# Patient Record
Sex: Female | Born: 1970 | Race: White | Hispanic: No | Smoking: Never smoker
Health system: Southern US, Community
[De-identification: ages and names within clinical notes are randomized; demographics above are authoritative.]

## PROBLEM LIST (undated history)

## (undated) DIAGNOSIS — C539 Malignant neoplasm of cervix uteri, unspecified: Secondary | ICD-10-CM

## (undated) DIAGNOSIS — G510 Bell's palsy: Secondary | ICD-10-CM

---

## 1998-03-13 ENCOUNTER — Ambulatory Visit (HOSPITAL_COMMUNITY): Admission: RE | Admit: 1998-03-13 | Discharge: 1998-03-13 | Payer: Self-pay | Admitting: Obstetrics & Gynecology

## 1998-08-26 ENCOUNTER — Other Ambulatory Visit: Admission: RE | Admit: 1998-08-26 | Discharge: 1998-08-26 | Payer: Self-pay | Admitting: Obstetrics & Gynecology

## 1998-12-09 ENCOUNTER — Other Ambulatory Visit: Admission: RE | Admit: 1998-12-09 | Discharge: 1998-12-09 | Payer: Self-pay | Admitting: Obstetrics & Gynecology

## 1999-04-15 ENCOUNTER — Other Ambulatory Visit: Admission: RE | Admit: 1999-04-15 | Discharge: 1999-04-15 | Payer: Self-pay | Admitting: Obstetrics & Gynecology

## 1999-05-29 ENCOUNTER — Encounter: Payer: Self-pay | Admitting: Emergency Medicine

## 1999-05-29 ENCOUNTER — Emergency Department (HOSPITAL_COMMUNITY): Admission: EM | Admit: 1999-05-29 | Discharge: 1999-05-29 | Payer: Self-pay | Admitting: Emergency Medicine

## 1999-10-09 ENCOUNTER — Emergency Department (HOSPITAL_COMMUNITY): Admission: EM | Admit: 1999-10-09 | Discharge: 1999-10-09 | Payer: Self-pay | Admitting: Emergency Medicine

## 1999-10-12 ENCOUNTER — Emergency Department (HOSPITAL_COMMUNITY): Admission: EM | Admit: 1999-10-12 | Discharge: 1999-10-12 | Payer: Self-pay | Admitting: Emergency Medicine

## 2000-03-16 ENCOUNTER — Other Ambulatory Visit: Admission: RE | Admit: 2000-03-16 | Discharge: 2000-03-16 | Payer: Self-pay | Admitting: *Deleted

## 2000-10-31 ENCOUNTER — Encounter: Payer: Self-pay | Admitting: *Deleted

## 2000-10-31 ENCOUNTER — Ambulatory Visit (HOSPITAL_COMMUNITY): Admission: RE | Admit: 2000-10-31 | Discharge: 2000-10-31 | Payer: Self-pay | Admitting: *Deleted

## 2000-12-27 ENCOUNTER — Other Ambulatory Visit: Admission: RE | Admit: 2000-12-27 | Discharge: 2000-12-27 | Payer: Self-pay | Admitting: Obstetrics and Gynecology

## 2001-02-09 ENCOUNTER — Inpatient Hospital Stay (HOSPITAL_COMMUNITY): Admission: AD | Admit: 2001-02-09 | Discharge: 2001-02-09 | Payer: Self-pay | Admitting: Obstetrics and Gynecology

## 2001-06-29 ENCOUNTER — Inpatient Hospital Stay (HOSPITAL_COMMUNITY): Admission: AD | Admit: 2001-06-29 | Discharge: 2001-06-29 | Payer: Self-pay | Admitting: Obstetrics and Gynecology

## 2001-07-09 ENCOUNTER — Inpatient Hospital Stay (HOSPITAL_COMMUNITY): Admission: AD | Admit: 2001-07-09 | Discharge: 2001-07-11 | Payer: Self-pay | Admitting: Obstetrics and Gynecology

## 2002-03-01 ENCOUNTER — Encounter: Payer: Self-pay | Admitting: *Deleted

## 2002-03-01 ENCOUNTER — Emergency Department (HOSPITAL_COMMUNITY): Admission: EM | Admit: 2002-03-01 | Discharge: 2002-03-01 | Payer: Self-pay | Admitting: *Deleted

## 2005-06-06 ENCOUNTER — Inpatient Hospital Stay (HOSPITAL_COMMUNITY): Admission: AD | Admit: 2005-06-06 | Discharge: 2005-06-06 | Payer: Self-pay | Admitting: Obstetrics and Gynecology

## 2005-08-19 ENCOUNTER — Encounter: Admission: RE | Admit: 2005-08-19 | Discharge: 2005-08-19 | Payer: Self-pay | Admitting: Obstetrics and Gynecology

## 2007-06-07 ENCOUNTER — Emergency Department (HOSPITAL_COMMUNITY): Admission: EM | Admit: 2007-06-07 | Discharge: 2007-06-07 | Payer: Self-pay | Admitting: Emergency Medicine

## 2008-01-13 ENCOUNTER — Emergency Department (HOSPITAL_COMMUNITY): Admission: EM | Admit: 2008-01-13 | Discharge: 2008-01-13 | Payer: Self-pay | Admitting: Family Medicine

## 2009-10-01 ENCOUNTER — Emergency Department (HOSPITAL_COMMUNITY): Admission: EM | Admit: 2009-10-01 | Discharge: 2009-10-01 | Payer: Self-pay | Admitting: Family Medicine

## 2009-10-06 ENCOUNTER — Emergency Department (HOSPITAL_COMMUNITY): Admission: EM | Admit: 2009-10-06 | Discharge: 2009-10-06 | Payer: Self-pay | Admitting: Family Medicine

## 2010-03-12 ENCOUNTER — Emergency Department (HOSPITAL_BASED_OUTPATIENT_CLINIC_OR_DEPARTMENT_OTHER): Admission: EM | Admit: 2010-03-12 | Discharge: 2010-03-12 | Payer: Self-pay | Admitting: Emergency Medicine

## 2010-05-25 ENCOUNTER — Emergency Department (HOSPITAL_COMMUNITY)
Admission: EM | Admit: 2010-05-25 | Discharge: 2010-05-25 | Payer: Self-pay | Source: Home / Self Care | Admitting: Family Medicine

## 2010-05-26 ENCOUNTER — Emergency Department (HOSPITAL_BASED_OUTPATIENT_CLINIC_OR_DEPARTMENT_OTHER)
Admission: EM | Admit: 2010-05-26 | Discharge: 2010-05-26 | Payer: Self-pay | Source: Home / Self Care | Admitting: Emergency Medicine

## 2010-09-09 ENCOUNTER — Inpatient Hospital Stay (HOSPITAL_COMMUNITY)
Admission: AD | Admit: 2010-09-09 | Discharge: 2010-09-09 | Payer: Self-pay | Source: Home / Self Care | Attending: Obstetrics and Gynecology | Admitting: Obstetrics and Gynecology

## 2010-09-13 LAB — URINALYSIS, ROUTINE W REFLEX MICROSCOPIC
Bilirubin Urine: NEGATIVE
Hgb urine dipstick: NEGATIVE
Ketones, ur: NEGATIVE mg/dL
Nitrite: NEGATIVE
Protein, ur: NEGATIVE mg/dL
Specific Gravity, Urine: >1.03 — ABNORMAL HIGH (ref 1.005–1.030)
Urine Glucose, Fasting: NEGATIVE mg/dL
Urobilinogen, UA: 0.2 mg/dL (ref 0.0–1.0)
pH: 5.5 (ref 5.0–8.0)

## 2010-09-13 LAB — POCT PREGNANCY, URINE: Preg Test, Ur: NEGATIVE

## 2010-11-11 LAB — POCT URINALYSIS DIPSTICK
Bilirubin Urine: NEGATIVE
Glucose, UA: NEGATIVE mg/dL
Hgb urine dipstick: NEGATIVE
Ketones, ur: NEGATIVE mg/dL
Nitrite: NEGATIVE
Protein, ur: NEGATIVE mg/dL
Specific Gravity, Urine: 1.015 (ref 1.005–1.030)
Urobilinogen, UA: 0.2 mg/dL (ref 0.0–1.0)
pH: 7 (ref 5.0–8.0)

## 2010-11-11 LAB — POCT PREGNANCY, URINE: Preg Test, Ur: NEGATIVE

## 2011-01-14 NOTE — H&P (Signed)
[facility] [employer]  Patient:    [name], [name] Visit Number: [id number] MRN: [id number]          Service Type: Attending:  Naima A. Normand [name], M.D. Dictated by:   Tracey [name], C.N.M. Adm. Date:  07/09/01                           History and Physical  HISTORY OF PRESENT ILLNESS:   Tracey [name] is a 40 year old gravida 4 para 0-2-1-2 at 46 weeks, EDD July 23, 2001, who presents with spontaneous rupture of membranes at home at approximately midnight for clear fluid.  She reports positive fetal movement, no bleeding.  Denies any headache, visual changes, or epigastric pain.  Her pregnancy has been followed by the M.D. service at [facility] and is remarkable for: 1. Questionable incompetent cervix. 2. History of rapid labor. 3. Two preterm deliveries at 36 weeks. 4. History of LEEP. 5. History of preterm labor with cervix change. 6. History of macrosomia - 10 pound 5 ounce infant delivered at 36 weeks. 7. Bells palsy. 8. MRI in the first trimester secondary to Bells palsy. 9. Group B strep negative.  This patient was initially evaluated at the office of [facility] on December 14, 2000 at approximately eight weeks gestation.  EDC determined by dates confirmed with pregnancy ultrasonography.  Her pregnancy has been essentially unremarkable.  A cerclage was recommended and declined by the patient. [facility] for back pain and physical therapy.  She has been normotensive throughout with no proteinuria.  PRENATAL LABORATORY DATA:     On Dec 27, 2000:  Hemoglobin and hematocrit 11.9 and 35.9; platelets 192,000.  Blood type and Rh O positive, antibody screen negative.  VDRL nonreactive.  Rubella immune.  Hepatitis B surface antigen negative.  HIV declined.  Pap smear within normal limits.  GC and chlamydia negative.  AFP/free beta HCG within normal range.  At 28 weeks, one-hour glucose challenge 84 and  hemoglobin 13.8.  At 36 weeks, culture of the vaginal tract is negative for group B strep.  OBSTETRICAL HISTORY:          In 1994 at 36 weeks, a normal spontaneous vaginal delivery with the birth of a 7 pound 14 ounce female infant named Nutritional therapist; rapid labor - patient went from 4 cm to 10 cm in 10 minutes.  In 1996 at 36 weeks, a normal spontaneous vaginal delivery with the birth of a 10 pound 5 ounce female infant named Morrie Sheldon.  Patient delivered in MAU secondary to rapid labor.  In 1998, first trimester elective AB with no complications, and the present pregnancy.  ALLERGIES:                    PENICILLIN and SULFA.  MEDICATIONS:                  Prenatal vitamins.  HABITS:                       Patient denies the use of tobacco, alcohol, or illicit drugs.  MEDICAL HISTORY:              Anemia with her first pregnancy.  Patient has a history of early dilation at 32 weeks x 2 and delivery at 36 weeks with premature rupture of membranes at 36 weeks x 1.  Patient with a history of abnormal Pap  smear and LEEP procedure.  Pap smears have been within normal limits since that time.  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  Patient with a history of Bells palsy in February 2001.  FAMILY HISTORY:               Patients mother with a history of hypertension. Mother and maternal grandmother with varicosities.  Patients mother with a history of emphysema.  Maternal grandmother - breast cancer.  Maternal grandfather - lung cancer.  Maternal grandmother - stroke.  Maternal grandfather - Alzheimers disease.  GENETIC HISTORY:              Unremarkable.  There is no family history of familial or genetic disorders, children that died in infancy or that were born with birth defects.  SOCIAL HISTORY:               Tracey [name] is a 40 year old Caucasian single female.  The father of the baby, Tracey [name], is involved and supportive. They are of the [employer] faith.  REVIEW OF SYSTEMS:            There are no signs or symptoms suggestive of focal or systemic disease and the patient is typical of one with a uterine pregnancy at term with premature rupture of membranes in early labor.  PHYSICAL EXAMINATION:  VITAL SIGNS:                  Stable, afebrile.  HEENT:                        Unremarkable.  HEART:                        Regular rate and rhythm.  LUNGS:                        Clear.  ABDOMEN:                      Gravid in its contour.  Uterine fundus is noted to extend 40 cm above the level of the pubic symphysis.  Leopolds maneuvers find the infant to be in a longitudinal lie, cephalic presentation, and the estimated fetal weight is 8.5 pounds.  The baseline of the fetal heart rate monitor is 140s.  It is reactive and reassuring.  Patient is contracting approximately every six minutes, mildly and irregularly.  PELVIC:                       She is grossly ruptured for a large amount of clear fluid, fern positive.  Digital exam of the cervix finds it to be 3 cm dilated, 90% effaced, with the cephalic presenting part at a -1 station.  EXTREMITIES:                  Show no pathologic edema.  DTRs are 1+ with no clonus.  ASSESSMENT:                   1. Intrauterine pregnancy at term.                               2. Premature rupture of membranes.  3. Early labor.  PLAN:                         1. Admit per Dr. Jaymes [name].                               2. Routine M.D. orders.                               3. Follow expectantly in anticipation of                                  spontaneous vaginal delivery. Dictated by:   Tracey [name], C.N.M. Attending:  Naima A. Normand [name], M.D. DD:  07/09/01 TD:  07/09/01 Job: [id number]

## 2011-06-09 LAB — URINALYSIS, ROUTINE W REFLEX MICROSCOPIC
Bilirubin Urine: NEGATIVE
Glucose, UA: NEGATIVE
Ketones, ur: NEGATIVE
Nitrite: NEGATIVE
Protein, ur: 100 — AB
Specific Gravity, Urine: 1.02
Urobilinogen, UA: 1
pH: 8

## 2011-06-09 LAB — URINE MICROSCOPIC-ADD ON

## 2013-06-16 ENCOUNTER — Encounter (HOSPITAL_COMMUNITY): Payer: Self-pay | Admitting: Emergency Medicine

## 2013-06-16 ENCOUNTER — Emergency Department (HOSPITAL_COMMUNITY)
Admission: EM | Admit: 2013-06-16 | Discharge: 2013-06-16 | Disposition: A | Discharge status: Home / Self Care | Payer: Self-pay | Attending: Emergency Medicine | Admitting: Emergency Medicine

## 2013-06-16 DIAGNOSIS — Z859 Personal history of malignant neoplasm, unspecified: Secondary | ICD-10-CM | POA: Insufficient documentation

## 2013-06-16 DIAGNOSIS — N949 Unspecified condition associated with female genital organs and menstrual cycle: Secondary | ICD-10-CM | POA: Insufficient documentation

## 2013-06-16 DIAGNOSIS — Z880 Allergy status to penicillin: Secondary | ICD-10-CM | POA: Insufficient documentation

## 2013-06-16 DIAGNOSIS — R102 Pelvic and perineal pain: Secondary | ICD-10-CM

## 2013-06-16 DIAGNOSIS — R110 Nausea: Secondary | ICD-10-CM | POA: Insufficient documentation

## 2013-06-16 DIAGNOSIS — Z3202 Encounter for pregnancy test, result negative: Secondary | ICD-10-CM | POA: Insufficient documentation

## 2013-06-16 LAB — CBC WITH DIFFERENTIAL/PLATELET
Basophils Absolute: 0 10*3/uL (ref 0.0–0.1)
Basophils Relative: 0 % (ref 0–1)
Eosinophils Absolute: 0.2 10*3/uL (ref 0.0–0.7)
Eosinophils Relative: 2 % (ref 0–5)
HCT: 33.2 % — ABNORMAL LOW (ref 36.0–46.0)
Hemoglobin: 11.3 g/dL — ABNORMAL LOW (ref 12.0–15.0)
Lymphocytes Relative: 27 % (ref 12–46)
Lymphs Abs: 2.1 10*3/uL (ref 0.7–4.0)
MCH: 29.7 pg (ref 26.0–34.0)
MCHC: 34 g/dL (ref 30.0–36.0)
MCV: 87.1 fL (ref 78.0–100.0)
Monocytes Absolute: 0.6 10*3/uL (ref 0.1–1.0)
Monocytes Relative: 8 % (ref 3–12)
Neutro Abs: 4.9 10*3/uL (ref 1.7–7.7)
Neutrophils Relative %: 64 % (ref 43–77)
Platelets: 242 10*3/uL (ref 150–400)
RBC: 3.81 MIL/uL — ABNORMAL LOW (ref 3.87–5.11)
RDW: 13 % (ref 11.5–15.5)
WBC: 7.7 10*3/uL (ref 4.0–10.5)

## 2013-06-16 LAB — COMPREHENSIVE METABOLIC PANEL
ALT: 9 U/L (ref 0–35)
AST: 15 U/L (ref 0–37)
Albumin: 3.9 g/dL (ref 3.5–5.2)
Alkaline Phosphatase: 102 U/L (ref 39–117)
BUN: 10 mg/dL (ref 6–23)
CO2: 23 mEq/L (ref 19–32)
Calcium: 8.6 mg/dL (ref 8.4–10.5)
Chloride: 103 mEq/L (ref 96–112)
Creatinine, Ser: 0.75 mg/dL (ref 0.50–1.10)
GFR calc Af Amer: >90 mL/min (ref >90)
GFR calc non Af Amer: >90 mL/min (ref >90)
Glucose, Bld: 101 mg/dL — ABNORMAL HIGH (ref 70–99)
Potassium: 3.7 mEq/L (ref 3.5–5.1)
Sodium: 137 mEq/L (ref 135–145)
Total Bilirubin: 0.3 mg/dL (ref 0.3–1.2)
Total Protein: 7.1 g/dL (ref 6.0–8.3)

## 2013-06-16 LAB — POCT PREGNANCY, URINE: Preg Test, Ur: NEGATIVE

## 2013-06-16 LAB — URINALYSIS, ROUTINE W REFLEX MICROSCOPIC
Glucose, UA: NEGATIVE mg/dL
Ketones, ur: 15 mg/dL — AB
Nitrite: NEGATIVE
Protein, ur: 100 mg/dL — AB
Specific Gravity, Urine: 1.019 (ref 1.005–1.030)
Urobilinogen, UA: 0.2 mg/dL (ref 0.0–1.0)
pH: 6 (ref 5.0–8.0)

## 2013-06-16 LAB — URINE MICROSCOPIC-ADD ON

## 2013-06-16 LAB — LIPASE, BLOOD: Lipase: 30 U/L (ref 11–59)

## 2013-06-16 NOTE — ED Notes (Signed)
This RN attempted to start an IV and pt states she wants to hold off on IV until dr. Jarrett Ables meds through IV.

## 2013-06-16 NOTE — ED Notes (Signed)
Pt states c/o suprapubic pain beginning 0130. Pains feel like cramping and stabbing. Denies difficulty urinating, BM, vaginal discharge.

## 2013-06-16 NOTE — ED Notes (Signed)
Pt ambulated to restroom with slow, steady gait. NAD noted. 

## 2013-06-16 NOTE — ED Provider Notes (Signed)
Medical screening examination/treatment/procedure(s) were conducted as a shared visit with non-physician practitioner(s) or resident  and myself.  I personally evaluated the patient during the encounter and agree with the findings and plan unless otherwise indicated.  I have reviewed any xrays and/ or EKG's with the provider and I agree with interpretation.   Pelvic pain prior to arrival with mild bleeding.  Both resolved in ED.  Pt has no pain or concerns on exam/ discussion.  Abd soft/ NT/ND, well appearing.  Pt will return if pain recurs.  DC Pelvic pain  Enid Skeens, MD 06/16/13 2118

## 2013-06-16 NOTE — ED Provider Notes (Signed)
CSN: [id number]     Arrival date & time 06/16/13  0548 History   First MD Initiated Contact with Patient 06/16/13 [phone]     Chief Complaint  Patient presents with  . Abdominal Pain   (Consider location/radiation/quality/duration/timing/severity/associated sxs/prior Treatment) HPI Patient presents to the emergency department following an episode of sudden onset of lower pelvic pain that began at 1:30 a.m.  Patient is currently on her menstrual cycle. The patient states that she had some mild nausea, but no vomiting.  Patient denies chest pain, shortness of breath, back pain, dysuria, weakness, dizziness, fever, or syncope.  Patient, states, that she had an episode here in the emergency department, where she had a gush of blood and the pain completely resolved. Past Medical History  Diagnosis Date  . Cancer    History reviewed. No pertinent past surgical history. Family History  Problem Relation Age of Onset  . Hypertension Mother   . Cancer Maternal Aunt    History  Substance Use Topics  . Smoking status: Never Smoker   . Smokeless tobacco: Never Used  . Alcohol Use: No   OB History   Grav Para Term Preterm Abortions TAB SAB Ect Mult Living                 Review of Systems All other systems negative except as documented in the HPI. All pertinent positives and negatives as reviewed in the HPI. Allergies  Penicillins and Sulfa antibiotics  Home Medications  No current outpatient prescriptions on file. BP 138/98  Pulse 87  Resp 24  SpO2 97%  LMP 06/16/2013 Physical Exam  Nursing note and vitals reviewed. Constitutional: She is oriented to person, place, and time. She appears well-developed and well-nourished. No distress.  HENT:  Head: Normocephalic and atraumatic.  Mouth/Throat: Oropharynx is clear and moist.  Eyes: Pupils are equal, round, and reactive to light.  Neck: Normal range of motion. Neck supple.  Cardiovascular: Normal rate, regular rhythm and normal heart  sounds.   Pulmonary/Chest: Effort normal and breath sounds normal. No respiratory distress.  Abdominal: Soft. Bowel sounds are normal. She exhibits no distension. There is no tenderness. There is no rebound and no guarding.  Neurological: She is alert and oriented to person, place, and time.  Skin: Skin is warm. No erythema.    ED Course  Procedures (including critical care time) Since the patient is no longer having, symptoms.  She'll be discharged home with followup with her GYN.  Advised the patient to return here as needed for any worsening in her condition.I advised the patient that this could have been some blood that collected in the uterus with increasing pressure until the gush occurred and that is what the cause of her pain was.     Carlyle Dolly, PA-C 06/16/13 0725

## 2013-06-18 LAB — URINE CULTURE: Colony Count: 40000

## 2015-05-19 ENCOUNTER — Other Ambulatory Visit: Payer: Self-pay | Admitting: Oncology

## 2015-05-25 ENCOUNTER — Other Ambulatory Visit: Payer: Self-pay | Admitting: Oncology

## 2015-10-13 ENCOUNTER — Other Ambulatory Visit: Payer: Self-pay

## 2016-10-18 ENCOUNTER — Other Ambulatory Visit: Payer: Self-pay | Admitting: Internal Medicine

## 2016-10-18 DIAGNOSIS — Z1231 Encounter for screening mammogram for malignant neoplasm of breast: Secondary | ICD-10-CM

## 2016-11-01 ENCOUNTER — Ambulatory Visit: Payer: Self-pay

## 2018-03-28 ENCOUNTER — Other Ambulatory Visit: Payer: Self-pay

## 2018-03-28 ENCOUNTER — Emergency Department (HOSPITAL_COMMUNITY): Payer: Self-pay | Admitting: Anesthesiology

## 2018-03-28 ENCOUNTER — Emergency Department (HOSPITAL_COMMUNITY): Payer: Self-pay

## 2018-03-28 ENCOUNTER — Inpatient Hospital Stay (HOSPITAL_COMMUNITY)
Admission: EM | Admit: 2018-03-28 | Discharge: 2018-04-02 | DRG: 329 | Disposition: A | Discharge status: Home / Self Care | Payer: Self-pay | Attending: General Surgery | Admitting: General Surgery

## 2018-03-28 ENCOUNTER — Encounter (HOSPITAL_COMMUNITY): Payer: Self-pay | Admitting: Orthopedic Surgery

## 2018-03-28 ENCOUNTER — Encounter (HOSPITAL_COMMUNITY): Admission: EM | Disposition: A | Discharge status: Home / Self Care | Payer: Self-pay | Source: Home / Self Care

## 2018-03-28 DIAGNOSIS — K562 Volvulus: Principal | ICD-10-CM | POA: Diagnosis present

## 2018-03-28 DIAGNOSIS — G8929 Other chronic pain: Secondary | ICD-10-CM | POA: Diagnosis present

## 2018-03-28 DIAGNOSIS — R1900 Intra-abdominal and pelvic swelling, mass and lump, unspecified site: Secondary | ICD-10-CM

## 2018-03-28 DIAGNOSIS — Z809 Family history of malignant neoplasm, unspecified: Secondary | ICD-10-CM

## 2018-03-28 DIAGNOSIS — Z598 Other problems related to housing and economic circumstances: Secondary | ICD-10-CM

## 2018-03-28 DIAGNOSIS — N390 Urinary tract infection, site not specified: Secondary | ICD-10-CM | POA: Diagnosis present

## 2018-03-28 DIAGNOSIS — Z882 Allergy status to sulfonamides status: Secondary | ICD-10-CM

## 2018-03-28 DIAGNOSIS — Z8541 Personal history of malignant neoplasm of cervix uteri: Secondary | ICD-10-CM

## 2018-03-28 DIAGNOSIS — R109 Unspecified abdominal pain: Secondary | ICD-10-CM

## 2018-03-28 DIAGNOSIS — Z880 Allergy status to penicillin: Secondary | ICD-10-CM

## 2018-03-28 DIAGNOSIS — K689 Other disorders of retroperitoneum: Secondary | ICD-10-CM | POA: Diagnosis present

## 2018-03-28 DIAGNOSIS — Z8249 Family history of ischemic heart disease and other diseases of the circulatory system: Secondary | ICD-10-CM

## 2018-03-28 DIAGNOSIS — Z599 Problem related to housing and economic circumstances, unspecified: Secondary | ICD-10-CM

## 2018-03-28 DIAGNOSIS — K567 Ileus, unspecified: Secondary | ICD-10-CM | POA: Diagnosis not present

## 2018-03-28 DIAGNOSIS — D62 Acute posthemorrhagic anemia: Secondary | ICD-10-CM | POA: Diagnosis not present

## 2018-03-28 HISTORY — PX: COLON RESECTION: SHX5231

## 2018-03-28 HISTORY — DX: Bell's palsy: G51.0

## 2018-03-28 HISTORY — DX: Malignant neoplasm of cervix uteri, unspecified: C53.9

## 2018-03-28 LAB — URINALYSIS, ROUTINE W REFLEX MICROSCOPIC
BILIRUBIN URINE: NEGATIVE
Glucose, UA: NEGATIVE mg/dL
Hgb urine dipstick: NEGATIVE
KETONES UR: NEGATIVE mg/dL
Nitrite: POSITIVE — AB
Protein, ur: NEGATIVE mg/dL
SPECIFIC GRAVITY, URINE: 1.013 (ref 1.005–1.030)
pH: 7 (ref 5.0–8.0)

## 2018-03-28 LAB — COMPREHENSIVE METABOLIC PANEL
ALK PHOS: 110 U/L (ref 38–126)
ALT: 15 U/L (ref 0–44)
AST: 23 U/L (ref 15–41)
Albumin: 3.9 g/dL (ref 3.5–5.0)
Anion gap: 11 (ref 5–15)
BILIRUBIN TOTAL: 0.6 mg/dL (ref 0.3–1.2)
BUN: 8 mg/dL (ref 6–20)
CALCIUM: 9.3 mg/dL (ref 8.9–10.3)
CO2: 24 mmol/L (ref 22–32)
Chloride: 103 mmol/L (ref 98–111)
Creatinine, Ser: 0.89 mg/dL (ref 0.44–1.00)
GFR calc Af Amer: >60 mL/min (ref >60)
GFR calc non Af Amer: >60 mL/min (ref >60)
GLUCOSE: 119 mg/dL — AB (ref 70–99)
Potassium: 4 mmol/L (ref 3.5–5.1)
SODIUM: 138 mmol/L (ref 135–145)
Total Protein: 7.4 g/dL (ref 6.5–8.1)

## 2018-03-28 LAB — CBC
HCT: 37.7 % (ref 36.0–46.0)
HEMOGLOBIN: 11.9 g/dL — AB (ref 12.0–15.0)
MCH: 28.5 pg (ref 26.0–34.0)
MCHC: 31.6 g/dL (ref 30.0–36.0)
MCV: 90.2 fL (ref 78.0–100.0)
Platelets: 277 10*3/uL (ref 150–400)
RBC: 4.18 MIL/uL (ref 3.87–5.11)
RDW: 13 % (ref 11.5–15.5)
WBC: 7.2 10*3/uL (ref 4.0–10.5)

## 2018-03-28 LAB — I-STAT TROPONIN, ED: Troponin i, poc: 0.01 ng/mL (ref 0.00–0.08)

## 2018-03-28 LAB — ABO/RH: ABO/RH(D): O POS

## 2018-03-28 LAB — TYPE AND SCREEN
ABO/RH(D): O POS
Antibody Screen: NEGATIVE

## 2018-03-28 LAB — LIPASE, BLOOD: Lipase: 38 U/L (ref 11–51)

## 2018-03-28 LAB — POC URINE PREG, ED: PREG TEST UR: NEGATIVE

## 2018-03-28 LAB — I-STAT BETA HCG BLOOD, ED (MC, WL, AP ONLY): HCG, QUANTITATIVE: 7.2 m[IU]/mL — AB (ref <5)

## 2018-03-28 SURGERY — COLON RESECTION
Anesthesia: General | Site: Abdomen | Laterality: Right

## 2018-03-28 MED ORDER — MIDAZOLAM HCL 5 MG/5ML IJ SOLN
INTRAMUSCULAR | Status: DC | PRN
  Administered 2018-03-28: 2 mg via INTRAVENOUS

## 2018-03-28 MED ORDER — MORPHINE SULFATE (PF) 4 MG/ML IV SOLN
4.0000 mg | Freq: Once | INTRAVENOUS | Status: AC
  Administered 2018-03-28: 4 mg via INTRAVENOUS
  Filled 2018-03-28: qty 1

## 2018-03-28 MED ORDER — ENOXAPARIN SODIUM 40 MG/0.4ML ~~LOC~~ SOLN
40.0000 mg | SUBCUTANEOUS | Status: DC
  Administered 2018-03-29 – 2018-04-01 (×4): 40 mg via SUBCUTANEOUS
  Filled 2018-03-28 (×4): qty 0.4

## 2018-03-28 MED ORDER — ONDANSETRON HCL 4 MG/2ML IJ SOLN: 4.0000 mg | Freq: Four times a day (QID) | INTRAMUSCULAR | Status: DC | PRN

## 2018-03-28 MED ORDER — OXYCODONE-ACETAMINOPHEN 5-325 MG PO TABS
1.0000 | ORAL_TABLET | ORAL | Status: DC | PRN
  Administered 2018-03-28: 1 via ORAL
  Filled 2018-03-28 (×2): qty 1

## 2018-03-28 MED ORDER — FENTANYL CITRATE (PF) 100 MCG/2ML IJ SOLN: 25.0000 ug | INTRAMUSCULAR | Status: DC | PRN

## 2018-03-28 MED ORDER — DIPHENHYDRAMINE HCL 12.5 MG/5ML PO ELIX: 12.5000 mg | ORAL_SOLUTION | Freq: Four times a day (QID) | ORAL | Status: DC | PRN

## 2018-03-28 MED ORDER — OXYCODONE HCL 5 MG PO TABS: 5.0000 mg | ORAL_TABLET | Freq: Once | ORAL | Status: DC | PRN

## 2018-03-28 MED ORDER — SUGAMMADEX SODIUM 200 MG/2ML IV SOLN
INTRAVENOUS | Status: DC | PRN
  Administered 2018-03-28: 200 mg via INTRAVENOUS

## 2018-03-28 MED ORDER — ONDANSETRON HCL 4 MG/2ML IJ SOLN
INTRAMUSCULAR | Status: DC | PRN
  Administered 2018-03-28: 4 mg via INTRAVENOUS

## 2018-03-28 MED ORDER — PROMETHAZINE HCL 25 MG/ML IJ SOLN
12.5000 mg | Freq: Once | INTRAMUSCULAR | Status: AC
  Administered 2018-03-28: 12.5 mg via INTRAVENOUS

## 2018-03-28 MED ORDER — DIPHENHYDRAMINE HCL 25 MG PO CAPS: 25.0000 mg | ORAL_CAPSULE | Freq: Four times a day (QID) | ORAL | Status: DC | PRN

## 2018-03-28 MED ORDER — FAMOTIDINE IN NACL 20-0.9 MG/50ML-% IV SOLN
20.0000 mg | INTRAVENOUS | Status: DC
  Administered 2018-03-28 – 2018-03-30 (×3): 20 mg via INTRAVENOUS
  Filled 2018-03-28 (×3): qty 50

## 2018-03-28 MED ORDER — SODIUM CHLORIDE 0.9 % IV SOLN
1.0000 g | Freq: Once | INTRAVENOUS | Status: AC
  Administered 2018-03-28: 1 g via INTRAVENOUS
  Filled 2018-03-28: qty 10

## 2018-03-28 MED ORDER — FENTANYL CITRATE (PF) 250 MCG/5ML IJ SOLN
INTRAMUSCULAR | Status: AC
  Filled 2018-03-28: qty 5

## 2018-03-28 MED ORDER — FENTANYL CITRATE (PF) 100 MCG/2ML IJ SOLN
INTRAMUSCULAR | Status: AC
  Filled 2018-03-28: qty 2

## 2018-03-28 MED ORDER — PHENYLEPHRINE 40 MCG/ML (10ML) SYRINGE FOR IV PUSH (FOR BLOOD PRESSURE SUPPORT)
PREFILLED_SYRINGE | INTRAVENOUS | Status: AC
  Filled 2018-03-28: qty 20

## 2018-03-28 MED ORDER — ROCURONIUM BROMIDE 100 MG/10ML IV SOLN
INTRAVENOUS | Status: DC | PRN
  Administered 2018-03-28 (×2): 10 mg via INTRAVENOUS
  Administered 2018-03-28: 50 mg via INTRAVENOUS

## 2018-03-28 MED ORDER — NALOXONE HCL 0.4 MG/ML IJ SOLN: 0.4000 mg | INTRAMUSCULAR | Status: DC | PRN

## 2018-03-28 MED ORDER — OXYCODONE HCL 5 MG/5ML PO SOLN: 5.0000 mg | Freq: Once | ORAL | Status: DC | PRN

## 2018-03-28 MED ORDER — SODIUM CHLORIDE 0.9% FLUSH: 9.0000 mL | INTRAVENOUS | Status: DC | PRN

## 2018-03-28 MED ORDER — ACETAMINOPHEN 650 MG RE SUPP: 650.0000 mg | Freq: Four times a day (QID) | RECTAL | Status: DC | PRN

## 2018-03-28 MED ORDER — IOHEXOL 300 MG/ML  SOLN
100.0000 mL | Freq: Once | INTRAMUSCULAR | Status: AC | PRN
  Administered 2018-03-28: 100 mL via INTRAVENOUS

## 2018-03-28 MED ORDER — SODIUM CHLORIDE 0.9 % IV SOLN
INTRAVENOUS | Status: DC
  Administered 2018-03-28 – 2018-03-31 (×2): via INTRAVENOUS

## 2018-03-28 MED ORDER — LACTATED RINGERS IV SOLN
INTRAVENOUS | Status: DC | PRN
  Administered 2018-03-28 (×3): via INTRAVENOUS

## 2018-03-28 MED ORDER — PROMETHAZINE HCL 25 MG/ML IJ SOLN
INTRAMUSCULAR | Status: AC
  Filled 2018-03-28: qty 1

## 2018-03-28 MED ORDER — FENTANYL CITRATE (PF) 100 MCG/2ML IJ SOLN
INTRAMUSCULAR | Status: DC | PRN
  Administered 2018-03-28 (×4): 50 ug via INTRAVENOUS
  Administered 2018-03-28: 100 ug via INTRAVENOUS

## 2018-03-28 MED ORDER — FENTANYL CITRATE (PF) 100 MCG/2ML IJ SOLN
25.0000 ug | INTRAMUSCULAR | Status: DC | PRN
  Administered 2018-03-28 (×2): 25 ug via INTRAVENOUS

## 2018-03-28 MED ORDER — LACTATED RINGERS IV SOLN: INTRAVENOUS | Status: DC

## 2018-03-28 MED ORDER — MORPHINE SULFATE (PF) 2 MG/ML IV SOLN
2.0000 mg | INTRAVENOUS | Status: DC | PRN
  Administered 2018-03-28: 2 mg via INTRAVENOUS
  Filled 2018-03-28: qty 1

## 2018-03-28 MED ORDER — MIDAZOLAM HCL 2 MG/2ML IJ SOLN
INTRAMUSCULAR | Status: AC
  Filled 2018-03-28: qty 2

## 2018-03-28 MED ORDER — SODIUM CHLORIDE 0.9 % IV SOLN
INTRAVENOUS | Status: DC | PRN
  Administered 2018-03-28: 100 ug/min via INTRAVENOUS

## 2018-03-28 MED ORDER — HYDRALAZINE HCL 20 MG/ML IJ SOLN: 10.0000 mg | INTRAMUSCULAR | Status: DC | PRN

## 2018-03-28 MED ORDER — DEXTROSE-NACL 5-0.9 % IV SOLN
INTRAVENOUS | Status: DC
  Administered 2018-03-29 – 2018-03-30 (×4): via INTRAVENOUS

## 2018-03-28 MED ORDER — DIPHENHYDRAMINE HCL 50 MG/ML IJ SOLN: 12.5000 mg | Freq: Four times a day (QID) | INTRAMUSCULAR | Status: DC | PRN

## 2018-03-28 MED ORDER — ONDANSETRON 4 MG PO TBDP: 4.0000 mg | ORAL_TABLET | Freq: Four times a day (QID) | ORAL | Status: DC | PRN

## 2018-03-28 MED ORDER — LIDOCAINE 2% (20 MG/ML) 5 ML SYRINGE
INTRAMUSCULAR | Status: AC
  Filled 2018-03-28: qty 15

## 2018-03-28 MED ORDER — DIPHENHYDRAMINE HCL 50 MG/ML IJ SOLN
25.0000 mg | Freq: Four times a day (QID) | INTRAMUSCULAR | Status: DC | PRN
Start: 2018-03-28 — End: 2018-04-02

## 2018-03-28 MED ORDER — HYDROMORPHONE 1 MG/ML IV SOLN
INTRAVENOUS | Status: DC
  Administered 2018-03-29: 0.5 mg via INTRAVENOUS
  Administered 2018-03-29: 1.2 mg via INTRAVENOUS
  Administered 2018-03-29: 0.3 mg via INTRAVENOUS
  Administered 2018-03-29: 01:00:00 via INTRAVENOUS
  Administered 2018-03-29: 0.6 mg via INTRAVENOUS
  Administered 2018-03-30: 1.5 mg via INTRAVENOUS
  Filled 2018-03-28 (×2): qty 25

## 2018-03-28 MED ORDER — CIPROFLOXACIN IN D5W 400 MG/200ML IV SOLN
400.0000 mg | Freq: Two times a day (BID) | INTRAVENOUS | Status: AC
  Filled 2018-03-28: qty 200

## 2018-03-28 MED ORDER — KETOROLAC TROMETHAMINE 15 MG/ML IJ SOLN
15.0000 mg | INTRAMUSCULAR | Status: DC
  Filled 2018-03-28: qty 1

## 2018-03-28 MED ORDER — DEXAMETHASONE SODIUM PHOSPHATE 10 MG/ML IJ SOLN
INTRAMUSCULAR | Status: DC | PRN
  Administered 2018-03-28: 10 mg via INTRAVENOUS

## 2018-03-28 MED ORDER — METHOCARBAMOL 500 MG PO TABS: 500.0000 mg | ORAL_TABLET | Freq: Four times a day (QID) | ORAL | Status: DC | PRN

## 2018-03-28 MED ORDER — SUGAMMADEX SODIUM 500 MG/5ML IV SOLN
INTRAVENOUS | Status: AC
  Filled 2018-03-28: qty 5

## 2018-03-28 MED ORDER — PROPOFOL 10 MG/ML IV BOLUS
INTRAVENOUS | Status: DC | PRN
  Administered 2018-03-28: 130 mg via INTRAVENOUS
  Administered 2018-03-28: 50 mg via INTRAVENOUS

## 2018-03-28 MED ORDER — GABAPENTIN 300 MG PO CAPS
300.0000 mg | ORAL_CAPSULE | Freq: Two times a day (BID) | ORAL | Status: DC
  Administered 2018-03-28 – 2018-03-31 (×7): 300 mg via ORAL
  Filled 2018-03-28 (×7): qty 1

## 2018-03-28 MED ORDER — SIMETHICONE 80 MG PO CHEW: 40.0000 mg | CHEWABLE_TABLET | Freq: Four times a day (QID) | ORAL | Status: DC | PRN

## 2018-03-28 MED ORDER — ONDANSETRON HCL 4 MG/2ML IJ SOLN
4.0000 mg | Freq: Four times a day (QID) | INTRAMUSCULAR | Status: DC | PRN
  Administered 2018-03-30 – 2018-03-31 (×2): 4 mg via INTRAVENOUS
  Filled 2018-03-28 (×2): qty 2

## 2018-03-28 MED ORDER — GABAPENTIN 300 MG PO CAPS
300.0000 mg | ORAL_CAPSULE | ORAL | Status: AC
  Filled 2018-03-28 (×2): qty 1

## 2018-03-28 MED ORDER — ROCURONIUM BROMIDE 10 MG/ML (PF) SYRINGE
PREFILLED_SYRINGE | INTRAVENOUS | Status: AC
  Filled 2018-03-28: qty 20

## 2018-03-28 MED ORDER — PROPOFOL 10 MG/ML IV BOLUS
INTRAVENOUS | Status: AC
  Filled 2018-03-28: qty 20

## 2018-03-28 MED ORDER — 0.9 % SODIUM CHLORIDE (POUR BTL) OPTIME
TOPICAL | Status: DC | PRN
  Administered 2018-03-28: 2000 mL

## 2018-03-28 MED ORDER — FENTANYL CITRATE (PF) 100 MCG/2ML IJ SOLN
25.0000 ug | INTRAMUSCULAR | Status: DC | PRN
  Filled 2018-03-28: qty 2

## 2018-03-28 MED ORDER — LIDOCAINE HCL (CARDIAC) PF 100 MG/5ML IV SOSY
PREFILLED_SYRINGE | INTRAVENOUS | Status: DC | PRN
  Administered 2018-03-28: 60 mg via INTRAVENOUS

## 2018-03-28 MED ORDER — OXYCODONE HCL 5 MG PO TABS
5.0000 mg | ORAL_TABLET | ORAL | Status: DC | PRN
  Administered 2018-03-30: 5 mg via ORAL
  Administered 2018-03-31 – 2018-04-01 (×5): 10 mg via ORAL
  Filled 2018-03-28: qty 1
  Filled 2018-03-28 (×6): qty 2

## 2018-03-28 MED ORDER — ACETAMINOPHEN 325 MG PO TABS: 650.0000 mg | ORAL_TABLET | Freq: Four times a day (QID) | ORAL | Status: DC | PRN

## 2018-03-28 SURGICAL SUPPLY — 39 items
BLADE CLIPPER SURG (BLADE) IMPLANT
CANISTER SUCT 3000ML PPV (MISCELLANEOUS) ×3 IMPLANT
COVER SURGICAL LIGHT HANDLE (MISCELLANEOUS) ×3 IMPLANT
DRAIN CHANNEL 19F RND (DRAIN) ×3 IMPLANT
DRSG OPSITE POSTOP 4X8 (GAUZE/BANDAGES/DRESSINGS) ×3 IMPLANT
ELECT CAUTERY BLADE 6.4 (BLADE) ×3 IMPLANT
ELECT REM PT RETURN 9FT ADLT (ELECTROSURGICAL) ×3
ELECTRODE REM PT RTRN 9FT ADLT (ELECTROSURGICAL) ×1 IMPLANT
EVACUATOR SILICONE 100CC (DRAIN) ×3 IMPLANT
GAUZE SPONGE 4X4 12PLY STRL (GAUZE/BANDAGES/DRESSINGS) ×3 IMPLANT
GLOVE BIO SURGEON STRL SZ8 (GLOVE) ×6 IMPLANT
GLOVE BIOGEL PI IND STRL 8 (GLOVE) ×2 IMPLANT
GLOVE BIOGEL PI INDICATOR 8 (GLOVE) ×4
GOWN STRL REUS W/ TWL LRG LVL3 (GOWN DISPOSABLE) ×6 IMPLANT
GOWN STRL REUS W/ TWL XL LVL3 (GOWN DISPOSABLE) ×1 IMPLANT
GOWN STRL REUS W/TWL LRG LVL3 (GOWN DISPOSABLE) ×12
GOWN STRL REUS W/TWL XL LVL3 (GOWN DISPOSABLE) ×2
KIT TURNOVER KIT B (KITS) ×3 IMPLANT
LIGASURE IMPACT 36 18CM CVD LR (INSTRUMENTS) ×3 IMPLANT
NS IRRIG 1000ML POUR BTL (IV SOLUTION) ×6 IMPLANT
PACK COLON (CUSTOM PROCEDURE TRAY) ×3 IMPLANT
PAD ARMBOARD 7.5X6 YLW CONV (MISCELLANEOUS) ×3 IMPLANT
PENCIL BUTTON HOLSTER BLD 10FT (ELECTRODE) ×3 IMPLANT
RELOAD PROXIMATE 75MM BLUE (ENDOMECHANICALS) ×6 IMPLANT
SPECIMEN JAR X LARGE (MISCELLANEOUS) ×3 IMPLANT
SPONGE LAP 18X18 X RAY DECT (DISPOSABLE) IMPLANT
STAPLER GUN LINEAR PROX 60 (STAPLE) ×3 IMPLANT
STAPLER PROXIMATE 75MM BLUE (STAPLE) ×3 IMPLANT
STAPLER VISISTAT 35W (STAPLE) ×3 IMPLANT
SURGILUBE 2OZ TUBE FLIPTOP (MISCELLANEOUS) IMPLANT
SUT MON AB 3-0 SH 27 (SUTURE)
SUT MON AB 3-0 SH27 (SUTURE) IMPLANT
SUT PDS AB 1 CTX 36 (SUTURE) ×6 IMPLANT
SUT VIC AB 3-0 SH 18 (SUTURE) ×3 IMPLANT
SUT VICRYL AB 2 0 TIES (SUTURE) ×3 IMPLANT
SUT VICRYL AB 3 0 TIES (SUTURE) ×3 IMPLANT
TRAY FOLEY CATH 16FRSI W/METER (SET/KITS/TRAYS/PACK) ×3 IMPLANT
TUBE CONNECTING 12'X1/4 (SUCTIONS) ×2
TUBE CONNECTING 12X1/4 (SUCTIONS) ×4 IMPLANT

## 2018-03-28 NOTE — Anesthesia Preprocedure Evaluation (Addendum)
Anesthesia Evaluation  Patient identified by MRN, date of birth, ID band Patient awake    Reviewed: Allergy & Precautions, H&P , NPO status , Patient's Chart, lab work & pertinent test results  History of Anesthesia Complications Negative for: history of anesthetic complications  Airway Mallampati: III  TM Distance: >3 FB Neck ROM: full    Dental  (+) Dental Advidsory Given, Upper Dentures   Pulmonary neg pulmonary ROS,    breath sounds clear to auscultation       Cardiovascular negative cardio ROS   Rhythm:Regular     Neuro/Psych negative neurological ROS  negative psych ROS   GI/Hepatic Neg liver ROS, Cecal volvulus    Endo/Other  negative endocrine ROS  Renal/GU negative Renal ROS     Musculoskeletal   Abdominal   Peds  Hematology negative hematology ROS (+)   Anesthesia Other Findings Bell's Palsey right side.  Reproductive/Obstetrics                            Anesthesia Physical Anesthesia Plan  ASA: I  Anesthesia Plan: General   Post-op Pain Management:    Induction: Intravenous  PONV Risk Score and Plan: 3 and Ondansetron and Dexamethasone  Airway Management Planned: Oral ETT  Additional Equipment: None  Intra-op Plan:   Post-operative Plan: Extubation in OR  Informed Consent: I have reviewed the patients History and Physical, chart, labs and discussed the procedure including the risks, benefits and alternatives for the proposed anesthesia with the patient or authorized representative who has indicated his/her understanding and acceptance.   Dental Advisory Given  Plan Discussed with: CRNA and Surgeon  Anesthesia Plan Comments:        Anesthesia Quick Evaluation

## 2018-03-28 NOTE — H&P (Signed)
[facility] Surgery Admission Note  Tracey Stone [birth year]  [id number].    Requesting MD: Dr. Sherry Ruffing Chief Complaint/Reason for Consult: cecal volvulus   HPI:   Pt is a 47 yo female with a hx of bells palsy, cervical cancer s/p LEEP >7yr ago, who presented to the ED with complaints of abdominal pain. Pt states she has had intermittent lower abdominal pain for >558yr She was told it might be her gallbladder. She does not have a PCP. Last night she began having pain that progressively worsened to severe, sharp, constant, lower abdominal pain. Nothing made it better. Associated nausea and abdominal bloating. No other associated symptoms. Last BM was normal and yesterday morning. No BM or flatus since onset of pain. Pt states only mild pain at this time as she just received morphine. Pt denies previous abdominal surgery or anticoagulants. No PO intake today.   ROS:  Review of Systems  Constitutional: Negative for chills, diaphoresis and fever.  HENT: Negative for sore throat.   Respiratory: Negative for cough and shortness of breath.   Cardiovascular: Negative for chest pain.  Gastrointestinal: Positive for abdominal pain and nausea. Negative for blood in stool, constipation, diarrhea and vomiting.  Genitourinary: Negative for dysuria.  Skin: Negative for rash.  Neurological: Negative for dizziness and loss of consciousness.  All other systems reviewed and are negative.    Family History  Problem Relation Age of Onset  . Hypertension Mother   . Cancer Maternal Aunt     Past Medical History:  Diagnosis Date  . Cancer     No past surgical history on file.  Social History:  reports that she has never smoked. She has never used smokeless tobacco. She reports that she does not drink alcohol or use drugs.  Allergies:  Allergies  Allergen Reactions  . Penicillins Rash    Has patient had a PCN reaction causing immediate rash, facial/tongue/throat swelling, SOB or  lightheadedness with hypotension: No Has patient had a PCN reaction causing severe rash involving mucus membranes or skin necrosis: No Has patient had a PCN reaction that required hospitalization: No Has patient had a PCN reaction occurring within the last 10 years: No If all of the above answers are "NO", then may proceed with Cephalosporin use.  . Sulfa Antibiotics Nausea And Vomiting     (Not in a hospital admission)  Blood pressure 111/71, pulse (!) 52, temperature 97.8 F (36.6 C), temperature source Oral, resp. rate 20, height 5' 3"  (1.6 m), weight 61.2 kg (135 lb), SpO2 100 %.  Physical Exam  Constitutional: She is oriented to person, place, and time. She appears well-developed and well-nourished.  Non-toxic appearance. She does not appear ill. No distress.  HENT:  Head: Normocephalic and atraumatic.  Nose: Nose normal.  Mouth/Throat: Oropharynx is clear and moist. No oropharyngeal exudate.  Eyes: Pupils are equal, round, and reactive to light. Conjunctivae are normal. Right eye exhibits no discharge. Left eye exhibits no discharge. No scleral icterus.  Neck: Normal range of motion. Neck supple. No thyromegaly present.  Cardiovascular: Normal rate, regular rhythm, normal heart sounds and intact distal pulses.  No murmur heard. Pulses:      Radial pulses are 2+ on the right side, and 2+ on the left side.       Dorsalis pedis pulses are 2+ on the right side, and 2+ on the left side.  Pulmonary/Chest: Effort normal and breath sounds normal. No respiratory distress. She has no wheezes. She has no rhonchi. She  has no rales.  Abdominal: Soft. Normal appearance and bowel sounds are normal. She exhibits distension. There is no hepatosplenomegaly. There is tenderness (very mild of lower abdomen). There is no rigidity and no guarding.  Musculoskeletal: Normal range of motion. She exhibits no edema, tenderness or deformity.  Lymphadenopathy:    She has no cervical adenopathy.   Neurological: She is alert and oriented to person, place, and time.  Skin: Skin is warm and dry. No rash noted. She is not diaphoretic.  Nursing note and vitals reviewed.   Results for orders placed or performed during the hospital encounter of 03/28/18 (from the past 48 hour(s))  Urinalysis, Routine w reflex microscopic     Status: Abnormal   Collection Time: 03/28/18  3:08 AM  Result Value Ref Range   Color, Urine YELLOW YELLOW   APPearance HAZY (A) CLEAR   Specific Gravity, Urine 1.013 1.005 - 1.030   pH 7.0 5.0 - 8.0   Glucose, UA NEGATIVE NEGATIVE mg/dL   Hgb urine dipstick NEGATIVE NEGATIVE   Bilirubin Urine NEGATIVE NEGATIVE   Ketones, ur NEGATIVE NEGATIVE mg/dL   Protein, ur NEGATIVE NEGATIVE mg/dL   Nitrite POSITIVE (A) NEGATIVE   Leukocytes, UA SMALL (A) NEGATIVE   RBC / HPF 0-5 0 - 5 RBC/hpf   WBC, UA >50 (H) 0 - 5 WBC/hpf   Bacteria, UA MANY (A) NONE SEEN   Squamous Epithelial / LPF 0-5 0 - 5   WBC Clumps PRESENT    Mucus PRESENT    Amorphous Crystal PRESENT     Comment: Performed at [facility], [address], [place], [place] [address]  Lipase, blood     Status: None   Collection Time: 03/28/18  3:15 AM  Result Value Ref Range   Lipase 38 11 - 51 U/L    Comment: Performed at [facility] [address] [place], [place] [address]  Comprehensive metabolic panel     Status: Abnormal   Collection Time: 03/28/18  3:15 AM  Result Value Ref Range   Sodium 138 135 - 145 mmol/L   Potassium 4.0 3.5 - 5.1 mmol/L   Chloride 103 98 - 111 mmol/L   CO2 24 22 - 32 mmol/L   Glucose, Bld 119 (H) 70 - 99 mg/dL   BUN 8 6 - 20 mg/dL   Creatinine, Ser 0.89 0.44 - 1.00 mg/dL   Calcium 9.3 8.9 - 10.3 mg/dL   Total Protein 7.4 6.5 - 8.1 g/dL   Albumin 3.9 3.5 - 5.0 g/dL   AST 23 15 - 41 U/L   ALT 15 0 - 44 U/L   Alkaline Phosphatase 110 38 - 126 U/L   Total Bilirubin 0.6 0.3 - 1.2 mg/dL   GFR calc non Af Amer >60 >60 mL/min   GFR calc Af Amer >60 >60 mL/min     Comment: (NOTE) The eGFR has been calculated using the CKD EPI equation. This calculation has not been validated in all clinical situations. eGFR's persistently <60 mL/min signify possible Chronic Kidney Disease.    Anion gap 11 5 - 15    Comment: Performed at [facility] [address], [place] [address]  CBC     Status: Abnormal   Collection Time: 03/28/18  3:15 AM  Result Value Ref Range   WBC 7.2 4.0 - 10.5 K/uL   RBC 4.18 3.87 - 5.11 MIL/uL   Hemoglobin 11.9 (L) 12.0 - 15.0 g/dL   HCT 37.7  36.0 - 46.0 %   MCV 90.2 78.0 - 100.0 fL   MCH 28.5 26.0 - 34.0 pg   MCHC 31.6 30.0 - 36.0 g/dL   RDW 13.0 11.5 - 15.5 %   Platelets 277 150 - 400 K/uL    Comment: Performed at [facility], [place] [address] [place], [place] [address]  I-Stat beta hCG blood, ED     Status: Abnormal   Collection Time: 03/28/18  3:20 AM  Result Value Ref Range   I-stat hCG, quantitative 7.2 (H) <5 mIU/mL   Comment 3            Comment:   GEST. AGE      CONC.  (mIU/mL)   <=1 WEEK        5 - 50     2 WEEKS       50 - 500     3 WEEKS       100 - 10,000     4 WEEKS     1,000 - 30,000        FEMALE AND NON-PREGNANT FEMALE:     LESS THAN 5 mIU/mL   I-stat troponin, ED     Status: None   Collection Time: 03/28/18  3:20 AM  Result Value Ref Range   Troponin i, poc 0.01 0.00 - 0.08 ng/mL   Comment 3            Comment: Due to the release kinetics of cTnI, a negative result within the first hours of the onset of symptoms does not rule out myocardial infarction with certainty. If myocardial infarction is still suspected, repeat the test at appropriate intervals.   POC urine preg, ED (not at [facility])     Status: None   Collection Time: 03/28/18  7:43 AM  Result Value Ref Range   Preg Test, Ur NEGATIVE NEGATIVE    Comment:        THE SENSITIVITY OF THIS METHODOLOGY IS >24 mIU/mL    Dg Chest 2 View  Result Date: 03/28/2018 CLINICAL DATA:  Chest pain.  Nausea. EXAM: CHEST - 2 VIEW  COMPARISON:  None. FINDINGS: The cardiomediastinal contours are normal. The lungs are clear. Pulmonary vasculature is normal. No consolidation, pleural effusion, or pneumothorax. No acute osseous abnormalities are seen. IMPRESSION: No acute pulmonary process. Electronically Signed   By: Jeb Levering M.D.   On: 03/28/2018 03:48   Ct Abdomen Pelvis W Contrast  Result Date: 03/28/2018 CLINICAL DATA:  Abdominal pain EXAM: CT ABDOMEN AND PELVIS WITH CONTRAST TECHNIQUE: Multidetector CT imaging of the abdomen and pelvis was performed using the standard protocol following bolus administration of intravenous contrast. CONTRAST:  134m OMNIPAQUE IOHEXOL 300 MG/ML  SOLN COMPARISON:  None. FINDINGS: Lower chest: No acute abnormality. Hepatobiliary: Diffuse hepatic steatosis. Gallbladder is unremarkable. Pancreas: Unremarkable Spleen: Unremarkable Adrenals/Urinary Tract: Kidneys and adrenal glands are within normal limits. Bladder is within normal limits. Stomach/Bowel: The cecum is dilated and in the upper abdomen with a coffee bean configuration. There is twisting of the cecal mesentery in the right lower quadrant. There are no disproportionally dilated loops of small bowel. These findings are most consistent with cecal volvulus without obstruction. Stomach is unremarkable. Duodenum is within normal limits. 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 No evidence of aortic aneurysm. Reproductive: Uterus is within normal limits. Other than varices, adnexa are unremarkable. Other: There is a  large simple cyst within the left side of the abdomen measuring 10.8 x 9.4 x 13.7 cm. It appears to emanate from the retroperitoneum and has a benign appearance. Musculoskeletal: No vertebral compression deformity. 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 Correlate  clinically as for the need for reduction. 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 Electronically Signed   By: Marybelle Killings M.D.   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

## 2018-03-28 NOTE — Anesthesia Procedure Notes (Signed)
Procedure Name: Intubation Date/Time: 03/28/2018 2:32 PM Performed by: Neldon Newport, CRNA Pre-anesthesia Checklist: Timeout performed, Patient being monitored, Suction available, Emergency Drugs available and Patient identified Patient Re-evaluated:Patient Re-evaluated prior to induction Oxygen Delivery Method: Circle system utilized Preoxygenation: Pre-oxygenation with 100% oxygen Induction Type: IV induction Ventilation: Mask ventilation without difficulty Laryngoscope Size: Mac and 4 Grade View: Grade I Tube type: Oral Tube size: 7.0 mm Number of attempts: 1 Placement Confirmation: breath sounds checked- equal and bilateral,  positive ETCO2 and ETT inserted through vocal cords under direct vision Secured at: 21 cm Tube secured with: Tape Dental Injury: Teeth and Oropharynx as per pre-operative assessment

## 2018-03-28 NOTE — ED Provider Notes (Signed)
[facility] EMERGENCY DEPARTMENT Provider Note   CSN: [id number] Arrival date & time: 03/28/18  0257     History   Chief Complaint Chief Complaint  Patient presents with  . Abdominal Pain    HPI Tracey Stone is a 47 y.o. female.  Patient presents to the emergency department for evaluation of abdominal pain.  Patient experiencing diffuse abdominal pain.  She reports that this has been ongoing for several years.  She has recurrent abdominal pain intermittently.  Pain is usually around the umbilicus and across the abdomen.  Tonight it started in this area but then became more persistent on the left side and went all the way up into the left upper abdomen.  Pain radiates into the back when it occurs.  She has been thinking that it might be her gallbladder but has never got it checked out because she did not have insurance.  She reports the pain is so severe at times that she cannot breathe well because moving to breathe causes increased pain.  She has not had fever.     Past Medical History:  Diagnosis Date  . Cancer     There are no active problems to display for this patient.   No past surgical history on file.   OB History   None      Home Medications    Prior to Admission medications   Medication Sig Start Date End Date Taking? Authorizing Provider  Multiple Vitamin (MULTIVITAMIN WITH MINERALS) TABS tablet Take 1 tablet by mouth daily.    [provider]    Family History Family History  Problem Relation Age of Onset  . Hypertension Mother   . Cancer Maternal Aunt     Social History Social History   Tobacco Use  . Smoking status: Never Smoker  . Smokeless tobacco: Never Used  Substance Use Topics  . Alcohol use: No  . Drug use: No     Allergies   Penicillins and Sulfa antibiotics   Review of Systems Review of Systems  Gastrointestinal: Positive for abdominal pain.  All other systems reviewed and are  negative.    Physical Exam Updated Vital Signs BP 102/73   Pulse (!) 52   Temp 97.8 F (36.6 C) (Oral)   Resp 20   Ht 5\' 3"  (1.6 m)   Wt 61.2 kg (135 lb)   SpO2 99%   BMI 23.91 kg/m   Physical Exam  Constitutional: She is oriented to person, place, and time. She appears well-developed and well-nourished. No distress.  HENT:  Head: Normocephalic and atraumatic.  Right Ear: Hearing normal.  Left Ear: Hearing normal.  Nose: Nose normal.  Mouth/Throat: Oropharynx is clear and moist and mucous membranes are normal.  Eyes: Pupils are equal, round, and reactive to light. Conjunctivae and EOM are normal.  Neck: Normal range of motion. Neck supple.  Cardiovascular: Regular rhythm, S1 normal and S2 normal. Exam reveals no gallop and no friction rub.  No murmur heard. Pulmonary/Chest: Effort normal and breath sounds normal. No respiratory distress. She exhibits no tenderness.  Abdominal: Soft. Normal appearance and bowel sounds are normal. There is no hepatosplenomegaly. There is tenderness in the periumbilical area, suprapubic area, left upper quadrant and left lower quadrant. There is no rebound, no guarding, no tenderness at McBurney's point and negative Murphy's sign. No hernia.  Musculoskeletal: Normal range of motion.  Neurological: She is alert and oriented to person, place, and time. She has normal strength. No cranial  nerve deficit or sensory deficit. Coordination normal. GCS eye subscore is 4. GCS verbal subscore is 5. GCS motor subscore is 6.  Skin: Skin is warm, dry and intact. No rash noted. No cyanosis.  Psychiatric: She has a normal mood and affect. Her speech is normal and behavior is normal. Thought content normal.  Nursing note and vitals reviewed.    ED Treatments / Results  Labs (all labs ordered are listed, but only abnormal results are displayed) Labs Reviewed  COMPREHENSIVE METABOLIC PANEL - Abnormal; Notable for the following components:      Result Value    Glucose, Bld 119 (*)    All other components within normal limits  CBC - Abnormal; Notable for the following components:   Hemoglobin 11.9 (*)    All other components within normal limits  URINALYSIS, ROUTINE W REFLEX MICROSCOPIC - Abnormal; Notable for the following components:   APPearance HAZY (*)    Nitrite POSITIVE (*)    Leukocytes, UA SMALL (*)    WBC, UA >50 (*)    Bacteria, UA MANY (*)    All other components within normal limits  I-STAT BETA HCG BLOOD, ED ([facility], [facility], [facility] ONLY) - Abnormal; Notable for the following components:   I-stat hCG, quantitative 7.2 (*)    All other components within normal limits  LIPASE, BLOOD  I-STAT TROPONIN, ED    EKG EKG Interpretation  Date/Time:  Wednesday March 28 2018 03:04:50 EDT Ventricular Rate:  93 PR Interval:  130 QRS Duration: 58 QT Interval:  368 QTC Calculation: 457 R Axis:   78 Text Interpretation:  Sinus rhythm with Premature supraventricular complexes Nonspecific ST abnormality Abnormal ECG Confirmed by Orpah Greek [phone]) on 03/28/2018 4:47:44 AM   Radiology Dg Chest 2 View  Result Date: 03/28/2018 CLINICAL DATA:  Chest pain.  Nausea. EXAM: CHEST - 2 VIEW COMPARISON:  None. FINDINGS: The cardiomediastinal contours are normal. The lungs are clear. Pulmonary vasculature is normal. No consolidation, pleural effusion, or pneumothorax. No acute osseous abnormalities are seen. IMPRESSION: No acute pulmonary process. Electronically Signed   By: Jeb Levering M.D.   On: 03/28/2018 03:48    Procedures Procedures (including critical care time)  Medications Ordered in ED Medications  oxyCODONE-acetaminophen (PERCOCET/ROXICET) 5-325 MG per tablet 1 tablet (1 tablet Oral Given 03/28/18 0315)  cefTRIAXone (ROCEPHIN) 1 g in sodium chloride 0.9 % 100 mL IVPB (has no administration in time range)     Initial Impression / Assessment and Plan / ED Course  I have reviewed the triage vital signs and the nursing  notes.  Pertinent labs & imaging results that were available during my care of the patient were reviewed by me and considered in my medical decision making (see chart for details).     Patient presents to the emergency department for evaluation of abdominal pain.  She has been experiencing intermittent episodes of this pain for several years.  Pain became quite severe tonight which prompted her to come to the ER.  Pain is generally across her abdomen around the level of the umbilicus.  This is the way it began tonight.  It then intensified, however, and radiated up to the left upper abdomen.  Pain was severe and unrelenting for a period of time and then slowly is improving.  Urinalysis reveals infection.  This would not explain the pain that she has been having for years, but might in someway be related to the pain she is currently experiencing.  Will perform CT scan  to further evaluate for abdominal pathology as well as to rule out a kidney stone in connection with UTI. Will sign out to oncoming ER physician to follow up CT results.  Final Clinical Impressions(s) / ED Diagnoses   Final diagnoses:  Abdominal pain, unspecified abdominal location  Urinary tract infection without hematuria, site unspecified    ED Discharge Orders    None       Orpah Greek, MD 03/28/18 [phone]

## 2018-03-28 NOTE — Progress Notes (Signed)
Upper dentures removed and given to daughters and significant other.

## 2018-03-28 NOTE — ED Triage Notes (Signed)
Patient c/o lower abdominal pain radiating to epigastric area and to her back. This has been ongoing since 7pm. States that she can barely breathe due to the pain.

## 2018-03-28 NOTE — ED Provider Notes (Signed)
Care assumed from Dr. Betsey Holiday.   At time of transfer care, patient is awaiting CT results to look for etiology of symptoms for abdominal pain.  Patient was found to have urinary tract infection  and will need prescription for antibiotics at discharge.  Patient is awaiting CT results to make sure there is no diverticulitis, perforation, obstruction, infected stone, or other significant normality.  Next  Anticipate reassessment after work-up.  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    General surgery was called and patient will be taken to operating room for ex lap and admission afterwards.    Patient admitted to surgery in stable condition.    Clinical Impression: 1. Abdominal pain, unspecified abdominal location   2. 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)     Disposition: Admit  This note was prepared with assistance of Dragon voice recognition software. Occasional wrong-word or sound-a-like substitutions may have occurred due to the inherent limitations of voice recognition software.     Tegeler, Gwenyth Allegra, MD 03/28/18 [phone]

## 2018-03-28 NOTE — Transfer of Care (Signed)
Immediate Anesthesia Transfer of Care Note  Patient: Tracey Stone  Procedure(s) Performed: RIGHT PARTIAL COLECTOMY, LEFT RETROPERITONEAL CYSTIC MASS (Right Abdomen)  Patient Location: PACU  Anesthesia Type:General  Level of Consciousness: drowsy  Airway & Oxygen Therapy: Patient Spontanous Breathing  Post-op Assessment: Report given to RN and Post -op Vital signs reviewed and stable  Post vital signs: Reviewed and stable  Last Vitals:  Vitals Value Taken Time  BP 142/72 03/28/2018  4:59 PM  Temp    Pulse 89 03/28/2018  5:00 PM  Resp 15 03/28/2018  5:00 PM  SpO2 100 % 03/28/2018  5:00 PM  Vitals shown include unvalidated device data.  Last Pain:  Vitals:   03/28/18 1122  TempSrc:   PainSc: 0-No pain         Complications: No apparent anesthesia complications

## 2018-03-28 NOTE — Anesthesia Postprocedure Evaluation (Signed)
Anesthesia Post Note  Patient: Tracey Stone  Procedure(s) Performed: RIGHT PARTIAL COLECTOMY, LEFT RETROPERITONEAL CYSTIC MASS (Right Abdomen)     Patient location during evaluation: PACU Anesthesia Type: General Level of consciousness: awake and alert Pain management: pain level controlled Vital Signs Assessment: post-procedure vital signs reviewed and stable Respiratory status: spontaneous breathing, nonlabored ventilation, respiratory function stable and patient connected to nasal cannula oxygen Cardiovascular status: blood pressure returned to baseline and stable Postop Assessment: no apparent nausea or vomiting Anesthetic complications: no    Last Vitals:  Vitals:   03/28/18 1730 03/28/18 1745  BP: 132/83 136/78  Pulse: 78 72  Resp: 13 13  Temp:    SpO2: 100% 99%    Last Pain:  Vitals:   03/28/18 1745  TempSrc:   PainSc: Asleep                 Alastor Kneale S

## 2018-03-28 NOTE — Progress Notes (Signed)
Received patient from PACU. Dressings intact. Midline dressing drainage marked. JP charged to suction. Patient complaining of pain. Will continue to monitor.

## 2018-03-28 NOTE — Op Note (Signed)
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  She has a history of chronic abdominal pain for many years which comes and goes and is crampy in nature and sporadic.  CT scan was done which revealed a cecal volvulus with obstruction.  She also had a large left retroperitoneal cystic mass measuring over 10 cm in maximal diameter.  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  We discussed options of treatment of her cecal volvulus recommended operative repair since this is the only option for.  She is not a candidate for decompression with colonoscopy for this disease process.  The pathophysiology of this disease process was discussed.  I discussed addressing the left retroperitoneal cystic mass once I got in considering aspiration versus removal depending on findings.  Risk and benefits the procedure were discussed.  Possible complications of bleeding, infection, organ injury, DVT, death, abdominal wall complications, wound complications, cardiorespiratory complications renal complications and the need further surgery.  Discussed potential anastomotic leak and potential need for ostomy if she does not heal properly.  Discussed bowel obstructions and the potential complications of abdominal surgery.  Discussed nonoperative management with her is none.  She agreed to proceed with  surgery.  Description of procedure: The patient was met in the holding area.  Her CT scan and her examination were reviewed.  We discussed procedure at length with her family.  We discussed also the possibility of aspiration and/or biopsy of the left retroperitoneal mass versus resection.  It was unclear if he could be resected the setting given her cecal volvulus and therefore nonoperative management of this was also discussed.  We discussed the possibilities of potential pathophysiology of this.  They voiced understanding and agreed to proceed.  She was taken back to the operating room.  She was placed supine upon the operating table.  After induction of general anesthesia a Foley catheter was placed and the abdomen was prepped and draped in sterile fashion.  Films were available for review if needed.  Timeout was done.  She received preoperative antibiotics.  Midline incision was used.  Dissection was carried down to the midline fascia and this was opened in the midline retractor was placed.  The cecum was volvulized in the right colon had no significant attachments to the right retroperitoneum making the cecum very floppy and prone for cecal volvulus.  I elected to resect this over trying to pexied this.  The hepatic flexure was mobilized.  The right colon was divided at the hepatic flexure and this was mobilized further.  The colon was extremely floppy.  The terminal ileum was divided with a GIA 75 stapling device.  The transverse colon was divided in similar fashion.  LigaSure was used to take down the mesentery.  The specimen is passed off the field.  A side-to-side functional end-to-end anastomosis was created using a GIA-75 stapling device and  a TA 60 to close the common defect.  Mesentery closed with 2-0 Vicryl.  Abdominal cavity was irrigated and there is good hemostasis.  There is no tension at the anastomosis there is no kinking or twisting.  It was widely patent.  There is no bleeding upon creation  of the anastomosis.  A stitch was placed in the across the anastomosis for reinforcement.  This was placed in the right upper quadrant.  The liver and stomach are grossly normal as well as the gallbladder.  There was a large cystic mass in the left retroperitoneum noted.  This was lateral to the descending colon.  It was quite mobile.  Given its large size a history of chronic abdominal pain, I felt it was feasible to resect this since it is unclear to me if her chronic abdominal pain was from her issues with her cecum or the mass and she has been worked up in the past for this.  I opened the retroperitoneum along the white line of Toldt.  The colon was retracted medially.  It was a well-defined demarcated cystic mass.  I was able to bluntly dissect with my hand completely around this and remove this since it had no significant attachments to the retroperitoneum.  The ureter was identified and preserved.  The iliopsoas muscle was identified and preserved.  No major structures were cut in the resection of this large left retroperitoneal cystic mass.  He was sent to pathology for further evaluation.  Hemostasis achieved.  The left lower quadrant stab incision in 19 round drain was placed behind the left colon this was replaced back into the left retroperitoneum.  The remainder of the descending colon sigmoid colon and rectum were normal.  The bladder was normal.  There is no intra-pelvic pathology that I could see or feel.  The stomach was normal.  There is no evidence of malrotation.  Irrigation was used.  We then closed the fascia with double-stranded #1 PDS.  Subcutaneous tissues were irrigated.  Of note the colon protocol was utilized.  We then closed the skin with staples.  Honeycomb dressing placed.  JP placed to bulb suction.  All final counts found to be correct.  The patient was awoke extubated taken to recovery in satisfactory condition.

## 2018-03-29 ENCOUNTER — Encounter (HOSPITAL_COMMUNITY): Payer: Self-pay | Admitting: Surgery

## 2018-03-29 LAB — COMPREHENSIVE METABOLIC PANEL
ALT: 11 U/L (ref 0–44)
ANION GAP: 7 (ref 5–15)
AST: 19 U/L (ref 15–41)
Albumin: 3 g/dL — ABNORMAL LOW (ref 3.5–5.0)
Alkaline Phosphatase: 84 U/L (ref 38–126)
BILIRUBIN TOTAL: 0.6 mg/dL (ref 0.3–1.2)
BUN: 5 mg/dL — AB (ref 6–20)
CHLORIDE: 107 mmol/L (ref 98–111)
CO2: 27 mmol/L (ref 22–32)
Calcium: 8.5 mg/dL — ABNORMAL LOW (ref 8.9–10.3)
Creatinine, Ser: 0.95 mg/dL (ref 0.44–1.00)
Glucose, Bld: 144 mg/dL — ABNORMAL HIGH (ref 70–99)
POTASSIUM: 5 mmol/L (ref 3.5–5.1)
Sodium: 141 mmol/L (ref 135–145)
TOTAL PROTEIN: 5.9 g/dL — AB (ref 6.5–8.1)

## 2018-03-29 LAB — CBC
HEMATOCRIT: 34.5 % — AB (ref 36.0–46.0)
Hemoglobin: 10.6 g/dL — ABNORMAL LOW (ref 12.0–15.0)
MCH: 28.3 pg (ref 26.0–34.0)
MCHC: 30.7 g/dL (ref 30.0–36.0)
MCV: 92 fL (ref 78.0–100.0)
Platelets: 229 10*3/uL (ref 150–400)
RBC: 3.75 MIL/uL — ABNORMAL LOW (ref 3.87–5.11)
RDW: 13.3 % (ref 11.5–15.5)
WBC: 12.1 10*3/uL — ABNORMAL HIGH (ref 4.0–10.5)

## 2018-03-29 LAB — HIV ANTIBODY (ROUTINE TESTING W REFLEX): HIV SCREEN 4TH GENERATION: NONREACTIVE

## 2018-03-29 MED ORDER — ACETAMINOPHEN 650 MG RE SUPP: 650.0000 mg | Freq: Three times a day (TID) | RECTAL | Status: DC

## 2018-03-29 MED ORDER — ACETAMINOPHEN 325 MG PO TABS
650.0000 mg | ORAL_TABLET | Freq: Three times a day (TID) | ORAL | Status: DC
  Administered 2018-03-29 – 2018-03-31 (×6): 650 mg via ORAL
  Filled 2018-03-29 (×6): qty 2

## 2018-03-29 MED ORDER — IBUPROFEN 400 MG PO TABS
400.0000 mg | ORAL_TABLET | Freq: Three times a day (TID) | ORAL | Status: DC
  Administered 2018-03-29 – 2018-04-01 (×9): 400 mg via ORAL
  Filled 2018-03-29 (×9): qty 1

## 2018-03-29 NOTE — Progress Notes (Addendum)
[facility] Progress Note  1 Day Post-Op  Subjective: CC:  abd pain conrolled at rest but very painful with movement. Says PCA "knocks her out". Foley removed this AM but has not urinated. Denies nausea or vomiting. Denies flatus or BM.  Objective: Vital signs in last 24 hours: Temp:  [97.5 F (36.4 C)-99 F (37.2 C)] 98.8 F (37.1 C) (08/01 1026) Pulse Rate:  [64-89] 89 (08/01 1026) Resp:  [12-21] 12 (08/01 1214) BP: (98-142)/(65-84) 101/72 (08/01 1026) SpO2:  [95 %-100 %] 98 % (08/01 1026) Weight:  [61.9 kg (136 lb 7.4 oz)] 61.9 kg (136 lb 7.4 oz) (07/31 1830)    Intake/Output from previous day: 07/31 0701 - 08/01 0700 In: 3257.6 [I.V.:2457.6; IV Piggyback:100] Out: 1915 [Urine:1725; Drains:90; Blood:100] Intake/Output this shift: No intake/output data recorded.  PE: Gen:  Alert, NAD, pleasant Card:  Regular rate and rhythm, no edema Pulm:  Normal effort, 1000 cc on IS Abd: Soft, appropriately tender, +BS, honeycomb in place, JP with sanguinous drainage. Skin: warm and dry, no rashes  Psych: A&Ox3   Lab Results:  Recent Labs    03/28/18 0315 03/29/18 0535  WBC 7.2 12.1*  HGB 11.9* 10.6*  HCT 37.7 34.5*  PLT 277 229   BMET Recent Labs    03/28/18 0315 03/29/18 0535  NA 138 141  K 4.0 5.0  CL 103 107  CO2 24 27  GLUCOSE 119* 144*  BUN 8 5*  CREATININE 0.89 0.95  CALCIUM 9.3 8.5*   CMP     Component Value Date/Time   NA 141 03/29/2018 0535   K 5.0 03/29/2018 0535   CL 107 03/29/2018 0535   CO2 27 03/29/2018 0535   GLUCOSE 144 (H) 03/29/2018 0535   BUN 5 (L) 03/29/2018 0535   CREATININE 0.95 03/29/2018 0535   CALCIUM 8.5 (L) 03/29/2018 0535   PROT 5.9 (L) 03/29/2018 0535   ALBUMIN 3.0 (L) 03/29/2018 0535   AST 19 03/29/2018 0535   ALT 11 03/29/2018 0535   ALKPHOS 84 03/29/2018 0535   BILITOT 0.6 03/29/2018 0535   GFRNONAA >60 03/29/2018 0535   GFRAA >60 03/29/2018 0535   Lipase     Component Value Date/Time   LIPASE 38  03/28/2018 0315   Studies/Results: Dg Chest 2 View  Result Date: 03/28/2018 CLINICAL DATA:  Chest pain.  Nausea. EXAM: CHEST - 2 VIEW COMPARISON:  None. FINDINGS: The cardiomediastinal contours are normal. The lungs are clear. Pulmonary vasculature is normal. No consolidation, pleural effusion, or pneumothorax. No acute osseous abnormalities are seen. IMPRESSION: No acute pulmonary process. Electronically Signed   By: Jeb Levering M.D.   On: 03/28/2018 03:48   Ct Abdomen Pelvis W Contrast  Result Date: 03/28/2018 CLINICAL DATA:  Abdominal pain EXAM: CT ABDOMEN AND PELVIS WITH CONTRAST TECHNIQUE: Multidetector CT imaging of the abdomen and pelvis was performed using the standard protocol following bolus administration of intravenous contrast. CONTRAST:  154mL OMNIPAQUE IOHEXOL 300 MG/ML  SOLN COMPARISON:  None. FINDINGS: Lower chest: No acute abnormality. Hepatobiliary: Diffuse hepatic steatosis. Gallbladder is unremarkable. Pancreas: Unremarkable Spleen: Unremarkable Adrenals/Urinary Tract: Kidneys and adrenal glands are within normal limits. Bladder is within normal limits. Stomach/Bowel: The cecum is dilated and in the upper abdomen with a coffee bean configuration. There is twisting of the cecal mesentery in the right lower quadrant. There are no disproportionally dilated loops of small bowel. These findings are most consistent with cecal volvulus without obstruction. Stomach is unremarkable. Duodenum is within normal limits. 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 No evidence of aortic aneurysm. Reproductive: Uterus is within normal limits. Other than varices, adnexa are unremarkable. Other: There is a large simple cyst within the left side of the abdomen measuring 10.8 x 9.4 x 13.7 cm. It appears to emanate from the retroperitoneum and has a benign appearance. Musculoskeletal: No  vertebral compression deformity. 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 Correlate clinically as for the need for reduction. 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 Electronically Signed   By: Marybelle Killings M.D.   On: 03/28/2018 09:46    Anti-infectives: Anti-infectives (From admission, onward)   Start     Dose/Rate Route Frequency Ordered Stop   03/28/18 1845  ciprofloxacin (CIPRO) IVPB 400 mg     400 mg 200 mL/hr over 60 Minutes Intravenous Every 12 hours 03/28/18 1843 03/29/18 0644   03/28/18 0715  cefTRIAXone (ROCEPHIN) 1 g in sodium chloride 0.9 % 100 mL IVPB     1 g 200 mL/hr over 30 Minutes Intravenous  Once 03/28/18 0700 03/28/18 0755     Assessment/Plan Cecal volvulus S/P ex lap, partial colectomy 7/31 Dr. Brantley Stage -  POD#1 -  Afebrile, VSS, WBC 12 -  Start clears, pt encouraged to take it slow, await further bowel function -  Continue JP, 90cc/24h  -  PO pain control, continue PCA today and may discontinue tomorrow. -  Mobilize, IS  Left retroperitoneal mass S/P resection 03/28/18 Dr. Brantley Stage  -  Follow path, intraoperatively noted to be 10x10 cm, cystic     FEN: clears, IVF, pt attempting to urinate now, RN to bladder scan if unable to urinate. ID perioperative cipro VTE: SCD's, Lovenox Foley: removed today 8/1      LOS: 1 day    Jill Alexanders , [facility] 03/29/2018, 12:31 PM Pager: [phone] Consults: [phone] Mon-Fri 7:00 am-4:30 pm Sat-Sun 7:00 am-11:30 am

## 2018-03-30 LAB — CBC
HEMATOCRIT: 28.9 % — AB (ref 36.0–46.0)
HEMOGLOBIN: 8.8 g/dL — AB (ref 12.0–15.0)
MCH: 28.8 pg (ref 26.0–34.0)
MCHC: 30.4 g/dL (ref 30.0–36.0)
MCV: 94.4 fL (ref 78.0–100.0)
Platelets: 160 10*3/uL (ref 150–400)
RBC: 3.06 MIL/uL — ABNORMAL LOW (ref 3.87–5.11)
RDW: 13.6 % (ref 11.5–15.5)
WBC: 6.1 10*3/uL (ref 4.0–10.5)

## 2018-03-30 LAB — BASIC METABOLIC PANEL
Anion gap: 5 (ref 5–15)
BUN: 9 mg/dL (ref 6–20)
CALCIUM: 7.7 mg/dL — AB (ref 8.9–10.3)
CHLORIDE: 112 mmol/L — AB (ref 98–111)
CO2: 26 mmol/L (ref 22–32)
Creatinine, Ser: 0.86 mg/dL (ref 0.44–1.00)
GFR calc Af Amer: >60 mL/min (ref >60)
GFR calc non Af Amer: >60 mL/min (ref >60)
GLUCOSE: 104 mg/dL — AB (ref 70–99)
Potassium: 3.5 mmol/L (ref 3.5–5.1)
Sodium: 143 mmol/L (ref 135–145)

## 2018-03-30 MED ORDER — HYDROMORPHONE HCL 1 MG/ML IJ SOLN
0.5000 mg | INTRAMUSCULAR | Status: DC | PRN
  Administered 2018-03-30 (×2): 1 mg via INTRAVENOUS
  Filled 2018-03-30 (×2): qty 1

## 2018-03-30 MED ORDER — PROMETHAZINE HCL 25 MG/ML IJ SOLN
12.5000 mg | Freq: Four times a day (QID) | INTRAMUSCULAR | Status: DC | PRN
  Filled 2018-03-30: qty 1

## 2018-03-30 NOTE — Progress Notes (Signed)
Alert/oriented x4. Denies pain, but reports tightness to the abdomen. Pain med administered and pt is using PCA. Denies N/V or diarrhea.  Daughter at bedside. Will continue to monitor.

## 2018-03-30 NOTE — Progress Notes (Signed)
[facility] Progress Note  2 Days Post-Op  Subjective: CC:  Pain improving. Still c/o mild distention. Denies nausea or vomiting. Having flatus. Denies BM. urinating without symptoms C/o mild light headedness upon standing that improves with time.  Objective: Vital signs in last 24 hours: Temp:  [97.4 F (36.3 C)-98.3 F (36.8 C)] 97.6 F (36.4 C) (08/02 1014) Pulse Rate:  [60-78] 67 (08/02 1014) Resp:  [8-20] 17 (08/02 1014) BP: (93-103)/(56-72) 100/66 (08/02 1014) SpO2:  [92 %-98 %] 92 % (08/02 1014) Last BM Date: 03/27/18  Intake/Output from previous day: 08/01 0701 - 08/02 0700 In: 3458.9 [P.O.:480; I.V.:2879.9; IV Piggyback:99.1] Out: 260 [Drains:260] Intake/Output this shift: Total I/O In: 300 [P.O.:300] Out: 40 [Drains:40]  PE: Gen:  Alert, NAD, pleasant Card:  Regular rate and rhythm Pulm:  Normal effort Abd: Soft, approp tender, mild distention bowel sounds present in all 4 quadrants, incisions C/D/I, LLQ drain in place sanguinous output Skin: warm and dry, no rashes  Psych: A&Ox3   Lab Results:  Recent Labs    03/29/18 0535 03/30/18 0622  WBC 12.1* 6.1  HGB 10.6* 8.8*  HCT 34.5* 28.9*  PLT 229 160   BMET Recent Labs    03/29/18 0535 03/30/18 0622  NA 141 143  K 5.0 3.5  CL 107 112*  CO2 27 26  GLUCOSE 144* 104*  BUN 5* 9  CREATININE 0.95 0.86  CALCIUM 8.5* 7.7*   PT/INR No results for input(s): LABPROT, INR in the last 72 hours. CMP     Component Value Date/Time   NA 143 03/30/2018 0622   K 3.5 03/30/2018 0622   CL 112 (H) 03/30/2018 0622   CO2 26 03/30/2018 0622   GLUCOSE 104 (H) 03/30/2018 0622   BUN 9 03/30/2018 0622   CREATININE 0.86 03/30/2018 0622   CALCIUM 7.7 (L) 03/30/2018 0622   PROT 5.9 (L) 03/29/2018 0535   ALBUMIN 3.0 (L) 03/29/2018 0535   AST 19 03/29/2018 0535   ALT 11 03/29/2018 0535   ALKPHOS 84 03/29/2018 0535   BILITOT 0.6 03/29/2018 0535   GFRNONAA >60 03/30/2018 0622   GFRAA >60 03/30/2018  0622   Lipase     Component Value Date/Time   LIPASE 38 03/28/2018 0315       Studies/Results: No results found.  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 Brantley Stage -  POD#2 -  Afebrile, VSS  -   full liquid diet  -  Continue JP -  PO pain control, D/C PCA, IV for breakthrough  -  Mobilize, IS  ABL anemia - hgb 8.8, though baseline hgb appears to be around 11-12; decrease IVF, re-check CBC in AM.   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 Brantley Stage  -  Follow path, intraoperatively noted to be 10x10 cm, cystic     FEN: full liquids, decrease IVF  ID perioperative cipro VTE: SCD's, Lovenox Foley: removed today 8/1     LOS: 2 days    Darci Current  [facility] Surgery 03/30/2018, 10:56 AM Pager: [phone] Consults: [phone] Mon-Fri 7:00 am-4:30 pm Sat-Sun 7:00 am-11:30 am

## 2018-03-30 NOTE — Progress Notes (Signed)
Dilaudid PCA pump discontinued, 18 ml was wasted. Waste witnessed by Junius Finner., RN. Patient currently sitting comfortably, will continue to monitor.

## 2018-03-31 ENCOUNTER — Encounter (HOSPITAL_COMMUNITY): Payer: Self-pay | Admitting: Surgery

## 2018-03-31 DIAGNOSIS — Z598 Other problems related to housing and economic circumstances: Secondary | ICD-10-CM

## 2018-03-31 DIAGNOSIS — Z599 Problem related to housing and economic circumstances, unspecified: Secondary | ICD-10-CM

## 2018-03-31 DIAGNOSIS — R1900 Intra-abdominal and pelvic swelling, mass and lump, unspecified site: Secondary | ICD-10-CM

## 2018-03-31 LAB — CBC
HEMATOCRIT: 27.5 % — AB (ref 36.0–46.0)
Hemoglobin: 8.4 g/dL — ABNORMAL LOW (ref 12.0–15.0)
MCH: 28.4 pg (ref 26.0–34.0)
MCHC: 30.5 g/dL (ref 30.0–36.0)
MCV: 92.9 fL (ref 78.0–100.0)
PLATELETS: 180 10*3/uL (ref 150–400)
RBC: 2.96 MIL/uL — ABNORMAL LOW (ref 3.87–5.11)
RDW: 13.4 % (ref 11.5–15.5)
WBC: 6.3 10*3/uL (ref 4.0–10.5)

## 2018-03-31 MED ORDER — SODIUM CHLORIDE 0.9% FLUSH: 3.0000 mL | INTRAVENOUS | Status: DC | PRN

## 2018-03-31 MED ORDER — SODIUM CHLORIDE 0.9 % IV SOLN: 250.0000 mL | INTRAVENOUS | Status: DC | PRN

## 2018-03-31 MED ORDER — ACETAMINOPHEN 500 MG PO TABS
1000.0000 mg | ORAL_TABLET | Freq: Three times a day (TID) | ORAL | Status: DC
  Administered 2018-03-31 – 2018-04-02 (×7): 1000 mg via ORAL
  Filled 2018-03-31 (×7): qty 2

## 2018-03-31 MED ORDER — SODIUM CHLORIDE 0.9% FLUSH
3.0000 mL | Freq: Two times a day (BID) | INTRAVENOUS | Status: DC
  Administered 2018-03-31 – 2018-04-01 (×2): 3 mL via INTRAVENOUS

## 2018-03-31 NOTE — Progress Notes (Addendum)
[facility] Progress Note  3 Days Post-Op  Subjective: CC:  Denies pain.  Tolerating liquids.  Questions about surgery.  No cardiac events  Objective: Vital signs in last 24 hours: Temp:  [97.6 F (36.4 C)-98.6 F (37 C)] 97.8 F (36.6 C) (08/03 0439) Pulse Rate:  [53-91] 53 (08/03 0439) Resp:  [17-20] 18 (08/03 0439) BP: (90-123)/(66-77) 107/70 (08/03 0439) SpO2:  [92 %-100 %] 98 % (08/03 0439) Last BM Date: (pre op)  Intake/Output from previous day: 08/02 0701 - 08/03 0700 In: 1427.7 [P.O.:900; I.V.:527.7] Out: 140 [Drains:140] Intake/Output this shift: No intake/output data recorded.  PE: General: Pt awake/alert/oriented x4 in no major acute distress Eyes: PERRL, normal EOM. Sclera nonicteric Neuro: CN II-XII intact w/o focal sensory/motor deficits. Lymph: No head/neck/groin lymphadenopathy Psych:  No delerium/psychosis/paranoia HENT: Normocephalic, Mucus membranes moist.  No thrush Neck: Supple, No tracheal deviation Chest: No pain.  Good respiratory excursion. CV:  Pulses intact.  Regular rhythm MS: Normal AROM mjr joints.  No obvious deformity  Abdomen: Overweight but soft.  Mild tenderness at incision only.  Dressings clear.  Drain with thinly serous/serosanguineous fluid.  No peritonitis or guarding.    Ext:  SCDs BLE.  No significant edema.  No cyanosis Skin: No petechiae / purpura   Lab Results:  Recent Labs    03/30/18 0622 03/31/18 0511  WBC 6.1 6.3  HGB 8.8* 8.4*  HCT 28.9* 27.5*  PLT 160 180   BMET Recent Labs    03/29/18 0535 03/30/18 0622  NA 141 143  K 5.0 3.5  CL 107 112*  CO2 27 26  GLUCOSE 144* 104*  BUN 5* 9  CREATININE 0.95 0.86  CALCIUM 8.5* 7.7*   PT/INR No results for input(s): LABPROT, INR in the last 72 hours. CMP     Component Value Date/Time   NA 143 03/30/2018 0622   K 3.5 03/30/2018 0622   CL 112 (H) 03/30/2018 0622   CO2 26 03/30/2018 0622   GLUCOSE 104 (H) 03/30/2018 0622   BUN 9  03/30/2018 0622   CREATININE 0.86 03/30/2018 0622   CALCIUM 7.7 (L) 03/30/2018 0622   PROT 5.9 (L) 03/29/2018 0535   ALBUMIN 3.0 (L) 03/29/2018 0535   AST 19 03/29/2018 0535   ALT 11 03/29/2018 0535   ALKPHOS 84 03/29/2018 0535   BILITOT 0.6 03/29/2018 0535   GFRNONAA >60 03/30/2018 0622   GFRAA >60 03/30/2018 0622   Lipase     Component Value Date/Time   LIPASE 38 03/28/2018 0315       Studies/Results: No results found.  Anti-infectives: Anti-infectives (From admission, onward)   Start     Dose/Rate Route Frequency Ordered Stop   03/28/18 1845  ciprofloxacin (CIPRO) IVPB 400 mg     400 mg 200 mL/hr over 60 Minutes Intravenous Every 12 hours 03/28/18 1843 03/29/18 0644   03/28/18 0715  cefTRIAXone (ROCEPHIN) 1 g in sodium chloride 0.9 % 100 mL IVPB     1 g 200 mL/hr over 30 Minutes Intravenous  Once 03/28/18 0700 03/28/18 0755     Patient Active Problem List   Diagnosis Date Noted  . Left retroperitoneal mass s/p resection 03/28/2018 03/31/2018  . Financial difficulties limiting insurance and access to medical care 03/31/2018  . 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 Brantley Stage Ileus resolving. Remove dressings per colon protocol Advance to soft diet. Stop IV fluids. Follow-up on pathology.  ABL anemia -hemoglobin low but stable.  Not clinically significant.  Should gradually resolve over time.  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 Brantley Stage  -  Follow path, intraoperatively noted to be 10x10 cm, cystic     FEN: As above.  Hold IV fluids and advance diet ID perioperative cipro VTE: SCD's, Lovenox Foley: removed today 8/1  Most likely surgical drain can be removed at discharge.  I updated the patient's status to the patient and nurse.  Recommendations were made.  Questions were answered.  They expressed understanding &  appreciation.     LOS: 3 days    Adin Hector , [facility] Surgery 03/31/2018, 9:36 AM Pager: [phone] Consults: [phone] Mon-Fri 7:00 am-4:30 pm Sat-Sun 7:00 am-11:30 am

## 2018-04-01 MED ORDER — IBUPROFEN 600 MG PO TABS: 600.0000 mg | ORAL_TABLET | Freq: Four times a day (QID) | ORAL | Status: DC | PRN

## 2018-04-01 MED ORDER — GABAPENTIN 300 MG PO CAPS
300.0000 mg | ORAL_CAPSULE | Freq: Three times a day (TID) | ORAL | Status: DC
  Administered 2018-04-01 – 2018-04-02 (×4): 300 mg via ORAL
  Filled 2018-04-01 (×4): qty 1

## 2018-04-01 NOTE — Progress Notes (Signed)
[facility] Progress Note  4 Days Post-Op  Subjective: CC:   Hesitant to try anything stronger than Tylenol.  Tolerating stuff by mouth.  Hesitant to try solids though..  Walking in hallways more.  Objective: Vital signs in last 24 hours: Temp:  [97.4 F (36.3 C)-98.2 F (36.8 C)] 97.5 F (36.4 C) (08/04 0526) Pulse Rate:  [49-78] 49 (08/04 0526) Resp:  [16-18] 16 (08/04 0526) BP: (116-138)/(73-88) 132/75 (08/04 0526) SpO2:  [97 %-100 %] 100 % (08/04 0526) Last BM Date: 03/27/18  Intake/Output from previous day: 08/03 0701 - 08/04 0700 In: 555 [P.O.:555] Out: 155 [Drains:155] Intake/Output this shift: No intake/output data recorded.  PE: General: Pt awake/alert/oriented x4 in no major acute distress Eyes: PERRL, normal EOM. Sclera nonicteric Neuro: CN II-XII intact w/o focal sensory/motor deficits. Lymph: No head/neck/groin lymphadenopathy Psych:  No delerium/psychosis/paranoia.  Anxious but consolable HENT: Normocephalic, Mucus membranes moist.  No thrush Neck: Supple, No tracheal deviation Chest: No pain.  Good respiratory excursion. CV:  Pulses intact.  Regular rhythm MS: Normal AROM mjr joints.  No obvious deformity  Abdomen: Soft.  Mild tenderness at incision only.  Dressings clear.  Drain with thinly serous/serosanguineous fluid.  No peritonitis or guarding.    Ext:  SCDs BLE.  No significant edema.  No cyanosis Skin: No petechiae / purpura   Lab Results:  Recent Labs    03/30/18 0622 03/31/18 0511  WBC 6.1 6.3  HGB 8.8* 8.4*  HCT 28.9* 27.5*  PLT 160 180   BMET Recent Labs    03/30/18 0622  NA 143  K 3.5  CL 112*  CO2 26  GLUCOSE 104*  BUN 9  CREATININE 0.86  CALCIUM 7.7*   PT/INR No results for input(s): LABPROT, INR in the last 72 hours. CMP     Component Value Date/Time   NA 143 03/30/2018 0622   K 3.5 03/30/2018 0622   CL 112 (H) 03/30/2018 0622   CO2 26 03/30/2018 0622   GLUCOSE 104 (H) 03/30/2018 0622   BUN 9  03/30/2018 0622   CREATININE 0.86 03/30/2018 0622   CALCIUM 7.7 (L) 03/30/2018 0622   PROT 5.9 (L) 03/29/2018 0535   ALBUMIN 3.0 (L) 03/29/2018 0535   AST 19 03/29/2018 0535   ALT 11 03/29/2018 0535   ALKPHOS 84 03/29/2018 0535   BILITOT 0.6 03/29/2018 0535   GFRNONAA >60 03/30/2018 0622   GFRAA >60 03/30/2018 0622   Lipase     Component Value Date/Time   LIPASE 38 03/28/2018 0315       Studies/Results: No results found.  Anti-infectives: Anti-infectives (From admission, onward)   Start     Dose/Rate Route Frequency Ordered Stop   03/28/18 1845  ciprofloxacin (CIPRO) IVPB 400 mg     400 mg 200 mL/hr over 60 Minutes Intravenous Every 12 hours 03/28/18 1843 03/29/18 0644   03/28/18 0715  cefTRIAXone (ROCEPHIN) 1 g in sodium chloride 0.9 % 100 mL IVPB     1 g 200 mL/hr over 30 Minutes Intravenous  Once 03/28/18 0700 03/28/18 0755     Patient Active Problem List   Diagnosis Date Noted  . Left retroperitoneal mass s/p resection 03/28/2018 03/31/2018    Priority: Medium  . Financial difficulties limiting insurance and access to medical care 03/31/2018  . 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 Brantley Stage  Ileus resolving. Advance to solid diet. Improve nonnarcotic pain control.  Try to encourage her to increase pain control so she can mobilize and improve Remove drain.   Follow off IV fluids. Follow-up on pathology.  ABL anemia -hemoglobin low but stable.  Not clinically significant.  Should gradually resolve over time.  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 Brantley Stage  -  Follow path, intraoperatively noted to be 10x10 cm, cystic     FEN: As above.  Hold IV fluids and advance diet ID perioperative cipro VTE: SCD's, Lovenox Foley: removed  Anxious but consolable.  Hold off on any medical intervention at this time  D/C patient from hospital when patient meets  criteria (anticipate in 1 day(s)):  Tolerating oral intake well Ambulating well Adequate pain control without IV medications Urinating  Having flatus Disposition planning in place   I updated the patient's status to the patient.  Recommendations were made.  Questions were answered.  She expressed understanding & appreciation.     LOS: 4 days    Adin Hector , [facility] Surgery 04/01/2018, 8:32 AM Pager: [phone] Consults: [phone] Mon-Fri 7:00 am-4:30 pm Sat-Sun 7:00 am-11:30 am

## 2018-04-01 NOTE — Plan of Care (Signed)

## 2018-04-01 NOTE — Discharge Instructions (Signed)
SURGERY: POST OP INSTRUCTIONS °(Surgery for small bowel obstruction, colon resection, etc) ° ° °###################################################################### ° °EAT °Gradually transition to a high fiber diet with a fiber supplement over the next few days after discharge ° °WALK °Walk an hour a day.  Control your pain to do that.   ° °CONTROL PAIN °Control pain so that you can walk, sleep, tolerate sneezing/coughing, go up/down stairs. ° °HAVE A BOWEL MOVEMENT DAILY °Keep your bowels regular to avoid problems.  OK to try a laxative to override constipation.  OK to use an antidairrheal to slow down diarrhea.  Call if not better after 2 tries ° °CALL IF YOU HAVE PROBLEMS/CONCERNS °Call if you are still struggling despite following these instructions. °Call if you have concerns not answered by these instructions ° °###################################################################### ° ° °DIET °Follow a light diet the first few days at home.  Start with a bland diet such as soups, liquids, starchy foods, low fat foods, etc.  If you feel full, bloated, or constipated, stay on a ful liquid or pureed/blenderized diet for a few days until you feel better and no longer constipated. °Be sure to drink plenty of fluids every day to avoid getting dehydrated (feeling dizzy, not urinating, etc.). °Gradually add a fiber supplement to your diet over the next week.  Gradually get back to a regular solid diet.  Avoid fast food or heavy meals the first week as you are more likely to get nauseated. °It is expected for your digestive tract to need a few months to get back to normal.  It is common for your bowel movements and stools to be irregular.  You will have occasional bloating and cramping that should eventually fade away.  Until you are eating solid food normally, off all pain medications, and back to regular activities; your bowels will not be normal. °Focus on eating a low-fat, high fiber diet the rest of your life  (See Getting to Good Bowel Health, below). ° °CARE of your INCISION or WOUND °It is good for closed incision and even open wounds to be washed every day.  Shower every day.  Short baths are fine.  Wash the incisions and wounds clean with soap & water.    °If you have a closed incision(s), wash the incision with soap & water every day.  You may leave closed incisions open to air if it is dry.   You may cover the incision with clean gauze & replace it after your daily shower for comfort. °If you have skin tapes (Steristrips) or skin glue (Dermabond) on your incision, leave them in place.  They will fall off on their own like a scab.  You may trim any edges that curl up with clean scissors.  If you have staples, set up an appointment for them to be removed in the office in 10 days after surgery.  °If you have a drain, wash around the skin exit site with soap & water and place a new dressing of gauze or band aid around the skin every day.  Keep the drain site clean & dry.    °If you have an open wound with packing, see wound care instructions.  In general, it is encouraged that you remove your dressing and packing, shower with soap & water, and replace your dressing once a day.  Pack the wound with clean gauze moistened with normal (0.9%) saline to keep the wound moist & uninfected.  Pressure on the dressing for 30 minutes will stop most wound   bleeding.  Eventually your body will heal & pull the open wound closed over the next few months.  °Raw open wounds will occasionally bleed or secrete yellow drainage until it heals closed.  Drain sites will drain a little until the drain is removed.  Even closed incisions can have mild bleeding or drainage the first few days until the skin edges scab over & seal.   °If you have an open wound with a wound vac, see wound vac care instructions. ° ° ° ° °ACTIVITIES as tolerated °Start light daily activities --- self-care, walking, climbing stairs-- beginning the day after surgery.   Gradually increase activities as tolerated.  Control your pain to be active.  Stop when you are tired.  Ideally, walk several times a day, eventually an hour a day.   °Most people are back to most day-to-day activities in a few weeks.  It takes 4-8 weeks to get back to unrestricted, intense activity. °If you can walk 30 minutes without difficulty, it is safe to try more intense activity such as jogging, treadmill, bicycling, low-impact aerobics, swimming, etc. °Save the most intensive and strenuous activity for last (Usually 4-8 weeks after surgery) such as sit-ups, heavy lifting, contact sports, etc.  Refrain from any intense heavy lifting or straining until you are off narcotics for pain control.  You will have off days, but things should improve week-by-week. °DO NOT PUSH THROUGH PAIN.  Let pain be your guide: If it hurts to do something, don't do it.  Pain is your body warning you to avoid that activity for another week until the pain goes down. °You may drive when you are no longer taking narcotic prescription pain medication, you can comfortably wear a seatbelt, and you can safely make sudden turns/stops to protect yourself without hesitating due to pain. °You may have sexual intercourse when it is comfortable. If it hurts to do something, stop. ° °MEDICATIONS °Take your usually prescribed home medications unless otherwise directed.   °Blood thinners:  °Usually you can restart any strong blood thinners after the second postoperative day.  It is OK to take aspirin right away.    ° If you are on strong blood thinners (warfarin/Coumadin, Plavix, Xerelto, Eliquis, Pradaxa, etc), discuss with your surgeon, medicine PCP, and/or cardiologist for instructions on when to restart the blood thinner & if blood monitoring is needed (PT/INR blood check, etc).   ° ° °PAIN CONTROL °Pain after surgery or related to activity is often due to strain/injury to muscle, tendon, nerves and/or incisions.  This pain is usually  short-term and will improve in a few months.  °To help speed the process of healing and to get back to regular activity more quickly, DO THE FOLLOWING THINGS TOGETHER: °1. Increase activity gradually.  DO NOT PUSH THROUGH PAIN °2. Use Ice and/or Heat °3. Try Gentle Massage and/or Stretching °4. Take over the counter pain medication °5. Take Narcotic prescription pain medication for more severe pain ° °Good pain control = faster recovery.  It is better to take more medicine to be more active than to stay in bed all day to avoid medications. °1.  Increase activity gradually °Avoid heavy lifting at first, then increase to lifting as tolerated over the next 6 weeks. °Do not “push through” the pain.  Listen to your body and avoid positions and maneuvers than reproduce the pain.  Wait a few days before trying something more intense °Walking an hour a day is encouraged to help your body recover faster   and more safely.  Start slowly and stop when getting sore.  If you can walk 30 minutes without stopping or pain, you can try more intense activity (running, jogging, aerobics, cycling, swimming, treadmill, sex, sports, weightlifting, etc.) °Remember: If it hurts to do it, then don’t do it! °2. Use Ice and/or Heat °You will have swelling and bruising around the incisions.  This will take several weeks to resolve. °Ice packs or heating pads (6-8 times a day, 30-60 minutes at a time) will help sooth soreness & bruising. °Some people prefer to use ice alone, heat alone, or alternate between ice & heat.  Experiment and see what works best for you.  Consider trying ice for the first few days to help decrease swelling and bruising; then, switch to heat to help relax sore spots and speed recovery. °Shower every day.  Short baths are fine.  It feels good!  Keep the incisions and wounds clean with soap & water.   °3. Try Gentle Massage and/or Stretching °Massage at the area of pain many times a day °Stop if you feel pain - do not  overdo it °4. Take over the counter pain medication °This helps the muscle and nerve tissues become less irritable and calm down faster °Choose ONE of the following over-the-counter anti-inflammatory medications: °Acetaminophen 500mg tabs (Tylenol) 1-2 pills with every meal and just before bedtime (avoid if you have liver problems or if you have acetaminophen in you narcotic prescription) °Naproxen 220mg tabs (ex. Aleve, Naprosyn) 1-2 pills twice a day (avoid if you have kidney, stomach, IBD, or bleeding problems) °Ibuprofen 200mg tabs (ex. Advil, Motrin) 3-4 pills with every meal and just before bedtime (avoid if you have kidney, stomach, IBD, or bleeding problems) °Take with food/snack several times a day as directed for at least 2 weeks to help keep pain / soreness down & more manageable. °5. Take Narcotic prescription pain medication for more severe pain °A prescription for strong pain control is often given to you upon discharge (for example: oxycodone/Percocet, hydrocodone/Norco/Vicodin, or tramadol/Ultram) °Take your pain medication as prescribed. °Be mindful that most narcotic prescriptions contain Tylenol (acetaminophen) as well - avoid taking too much Tylenol. °If you are having problems/concerns with the prescription medicine (does not control pain, nausea, vomiting, rash, itching, etc.), please call us [phone] to see if we need to switch you to a different pain medicine that will work better for you and/or control your side effects better. °If you need a refill on your pain medication, you must call the office before 4 pm and on weekdays only.  By federal law, prescriptions for narcotics cannot be called into a pharmacy.  They must be filled out on paper & picked up from our office by the patient or authorized caretaker.  Prescriptions cannot be filled after 4 pm nor on weekends.   ° °WHEN TO CALL US [phone] °Severe uncontrolled or worsening pain  °Fever over 101 F (38.5 C) °Concerns with  the incision: Worsening pain, redness, rash/hives, swelling, bleeding, or drainage °Reactions / problems with new medications (itching, rash, hives, nausea, etc.) °Nausea and/or vomiting °Difficulty urinating °Difficulty breathing °Worsening fatigue, dizziness, lightheadedness, blurred vision °Other concerns °If you are not getting better after two weeks or are noticing you are getting worse, contact our office [phone] for further advice.  We may need to adjust your medications, re-evaluate you in the office, send you to the emergency room, or see what other things we can do to help. °The   clinic staff is available to answer your questions during regular business hours (8:30am-5pm).  Please don’t hesitate to call and ask to speak to one of our nurses for clinical concerns.    °A surgeon from [facility] is always on call at the hospitals 24 hours/day °If you have a medical emergency, go to the nearest emergency room or call 911. ° °FOLLOW UP in our office °One the day of your discharge from the hospital (or the next business weekday), please call [facility] to set up or confirm an appointment to see your surgeon in the office for a follow-up appointment.  Usually it is 2-3 weeks after your surgery.   °If you have skin staples at your incision(s), let the office know so we can set up a time in the office for the nurse to remove them (usually around 10 days after surgery). °Make sure that you call for appointments the day of discharge (or the next business weekday) from the hospital to ensure a convenient appointment time. °IF YOU HAVE DISABILITY OR FAMILY LEAVE FORMS, BRING THEM TO THE OFFICE FOR PROCESSING.  DO NOT GIVE THEM TO YOUR DOCTOR. ° °[facility] °[address], [address], [place],   [address] ? °[phone] - Main °[phone] - Toll Free,  [phone] - Fax °[email or web address] ° °GETTING TO GOOD BOWEL HEALTH. °It is  expected for your digestive tract to need a few months to get back to normal.  It is common for your bowel movements and stools to be irregular.  You will have occasional bloating and cramping that should eventually fade away.  Until you are eating solid food normally, off all pain medications, and back to regular activities; your bowels will not be normal.   °Avoiding constipation °The goal: ONE SOFT BOWEL MOVEMENT A DAY!    °Drink plenty of fluids.  Choose water first. °TAKE A FIBER SUPPLEMENT EVERY DAY THE REST OF YOUR LIFE °During your first week back home, gradually add back a fiber supplement every day °Experiment which form you can tolerate.   There are many forms such as powders, tablets, wafers, gummies, etc °Psyllium bran (Metamucil), methylcellulose (Citrucel), Miralax or Glycolax, Benefiber, Flax Seed.  °Adjust the dose week-by-week (1/2 dose/day to 6 doses a day) until you are moving your bowels 1-2 times a day.  Cut back the dose or try a different fiber product if it is giving you problems such as diarrhea or bloating. °Sometimes a laxative is needed to help jump-start bowels if constipated until the fiber supplement can help regulate your bowels.  If you are tolerating eating & you are farting, it is okay to try a gentle laxative such as double dose MiraLax, prune juice, or Milk of Magnesia.  Avoid using laxatives too often. °Stool softeners can sometimes help counteract the constipating effects of narcotic pain medicines.  It can also cause diarrhea, so avoid using for too long. °If you are still constipated despite taking fiber daily, eating solids, and a few doses of laxatives, call our office. °Controlling diarrhea °Try drinking liquids and eating bland foods for a few days to avoid stressing your intestines further. °Avoid dairy products (especially milk & ice cream) for a short time.  The intestines often can lose the ability to digest lactose when stressed. °Avoid foods that cause gassiness or  bloating.  Typical foods include beans and other legumes, cabbage, broccoli, and dairy foods.  Avoid greasy, spicy, fast foods.  Every person has   some sensitivity to other foods, so listen to your body and avoid those foods that trigger problems for you. Probiotics (such as active yogurt, Align, etc) may help repopulate the intestines and colon with normal bacteria and calm down a sensitive digestive tract Adding a fiber supplement gradually can help thicken stools by absorbing excess fluid and retrain the intestines to act more normally.  Slowly increase the dose over a few weeks.  Too much fiber too soon can backfire and cause cramping & bloating. It is okay to try and slow down diarrhea with a few doses of antidiarrheal medicines.   Bismuth subsalicylate (ex. Kayopectate, Pepto Bismol) for a few doses can help control diarrhea.  Avoid if pregnant.   Loperamide (Imodium) can slow down diarrhea.  Start with one tablet (2mg ) first.  Avoid if you are having fevers or severe pain.  ILEOSTOMY PATIENTS WILL HAVE CHRONIC DIARRHEA since their colon is not in use.    Drink plenty of liquids.  You will need to drink even more glasses of water/liquid a day to avoid getting dehydrated. Record output from your ileostomy.  Expect to empty the bag every 3-4 hours at first.  Most people with a permanent ileostomy empty their bag 4-6 times at the least.   Use antidiarrheal medicine (especially Imodium) several times a day to avoid getting dehydrated.  Start with a dose at bedtime & breakfast.  Adjust up or down as needed.  Increase antidiarrheal medications as directed to avoid emptying the bag more than 8 times a day (every 3 hours). Work with your wound ostomy nurse to learn care for your ostomy.  See ostomy care instructions. TROUBLESHOOTING IRREGULAR BOWELS 1) Start with a soft & bland diet. No spicy, greasy, or fried foods.  2) Avoid gluten/wheat or dairy products from diet to see if symptoms improve. 3) Miralax  17gm or flax seed mixed in [place]. water or juice-daily. May use 2-4 times a day as needed. 4) Gas-X, Phazyme, etc. as needed for gas & bloating.  5) Prilosec (omeprazole) over-the-counter as needed 6)  Consider probiotics (Align, Activa, etc) to help calm the bowels down  Call your doctor if you are getting worse or not getting better.  Sometimes further testing (cultures, endoscopy, X-ray studies, CT scans, bloodwork, etc.) may be needed to help diagnose and treat the cause of the diarrhea. [facility], [place], [place], [place], [place]  [address] [phone] - Main.    [phone]  - Toll Free.   [phone] - Fax [email or web address]  Managing Pain  ######################################################################   CONTROL PAIN Control pain so that you can walk, sleep, tolerate sneezing/coughing, go up/down stairs.  (Good pain control is not pain free only when lying still, unable to move)  WALK Walk an hour a day.  Control your pain to do that.   HAVE A BOWEL MOVEMENT DAILY Keep your bowels regular to avoid problems.  OK to try a laxative to override constipation.  OK to use an antidairrheal to slow down diarrhea.  Call if not better after 2 tries  CALL IF YOU HAVE PROBLEMS/CONCERNS Call if you are still struggling despite following these instructions. Call if you have concerns not answered by these instructions  ######################################################################    Pain after surgery or related to activity is often due to strain/injury to muscle, tendon, nerves and/or incisions.  This pain is usually short-term and will improve in a few months.   Many people find it helpful to do  the following things TOGETHER to help speed the process of healing and to get back to regular activity more quickly:  1. Avoid heavy physical activity at first a. No lifting greater than 20 pounds at first, then increase to  lifting as tolerated over the next few weeks b. Do not push through the pain.  Listen to your body and avoid positions and maneuvers than reproduce the pain.  Wait a few days before trying something more intense c. Walking is okay as tolerated, but go slowly and stop when getting sore.  If you can walk 30 minutes without stopping or pain, you can try more intense activity (running, jogging, aerobics, cycling, swimming, treadmill, sex, sports, weightlifting, etc ) d. Remember: If it hurts to do it, then dont do it!  2. Take Anti-inflammatory medication a. Choose ONE of the following over-the-counter medications: i.            Acetaminophen 500mg  tabs (Tylenol) 1-2 pills with every meal and just before bedtime (avoid if you have liver problems) ii.            Naproxen 220mg  tabs (ex. Aleve) 1-2 pills twice a day (avoid if you have kidney, stomach, IBD, or bleeding problems) iii. Ibuprofen 200mg  tabs (ex. Advil, Motrin) 3-4 pills with every meal and just before bedtime (avoid if you have kidney, stomach, IBD, or bleeding problems) b. Take with food/snack around the clock for 1-2 weeks i. This helps the muscle and nerve tissues become less irritable and calm down faster  3. Use a Heating pad or Ice/Cold Pack a. 4-6 times a day b. May use warm bath/hottub  or showers  4. Try Gentle Massage and/or Stretching  a. at the area of pain many times a day b. stop if you feel pain - do not overdo it  Try these steps together to help you body heal faster and avoid making things get worse.  Doing just one of these things may not be enough.    If you are not getting better after two weeks or are noticing you are getting worse, contact our office for further advice; we may need to re-evaluate you & see what other things we can do to help.   GETTING TO GOOD BOWEL HEALTH.  ######################################################################  EAT Gradually transition to a high fiber diet with a fiber  supplement over the next few weeks after discharge.  Start with a pureed / full liquid diet (see below)  WALK Walk an hour a day.  Control your pain to do that.    HAVE A BOWEL MOVEMENT DAILY Keep your bowels regular to avoid problems.  OK to try a laxative to override constipation.  OK to use an antidairrheal to slow down diarrhea.  Call if not better after 2 tries  CALL IF YOU HAVE PROBLEMS/CONCERNS Call if you are still struggling despite following these instructions. Call if you have concerns not answered by these instructions  ######################################################################   Irregular bowel habits such as constipation and diarrhea can lead to many problems over time.  Having one soft bowel movement a day is the most important way to prevent further problems.  The anorectal canal is designed to handle stretching and feces to safely manage our ability to get rid of solid waste (feces, poop, stool) out of our body.  BUT, hard constipated stools can act like ripping concrete bricks and diarrhea can be a burning fire to this very sensitive area of our body, causing inflamed hemorrhoids, anal fissures,  increasing risk is perirectal abscesses, abdominal pain/bloating, an making irritable bowel worse.      The goal: ONE SOFT BOWEL MOVEMENT A DAY!  To have soft, regular bowel movements:   Drink plenty of fluids, consider 4-6 tall glasses of water a day.    Take plenty of fiber.  Fiber is the undigested part of plant food that passes into the colon, acting s natures broom to encourage bowel motility and movement.  Fiber can absorb and hold large amounts of water. This results in a larger, bulkier stool, which is soft and easier to pass. Work gradually over several weeks up to 6 servings a day of fiber (25g a day even more if needed) in the form of: o Vegetables -- Root (potatoes, carrots, turnips), leafy green (lettuce, salad greens, celery, spinach), or cooked high residue  (cabbage, broccoli, etc) o Fruit -- Fresh (unpeeled skin & pulp), Dried (prunes, apricots, cherries, etc ),  or stewed ( applesauce)  o Whole grain breads, pasta, etc (whole wheat)  o Bran cereals   Bulking Agents -- This type of water-retaining fiber generally is easily obtained each day by one of the following:  o Psyllium bran -- The psyllium plant is remarkable because its ground seeds can retain so much water. This product is available as Metamucil, Konsyl, Effersyllium, Per Diem Fiber, or the less expensive generic preparation in drug and health food stores. Although labeled a laxative, it really is not a laxative.  o Methylcellulose -- This is another fiber derived from wood which also retains water. It is available as Citrucel. o Polyethylene Glycol - and artificial fiber commonly called Miralax or Glycolax.  It is helpful for people with gassy or bloated feelings with regular fiber o Flax Seed - a less gassy fiber than psyllium  No reading or other relaxing activity while on the toilet. If bowel movements take longer than 5 minutes, you are too constipated  AVOID CONSTIPATION.  High fiber and water intake usually takes care of this.  Sometimes a laxative is needed to stimulate more frequent bowel movements, but   Laxatives are not a good long-term solution as it can wear the colon out.  They can help jump-start bowels if constipated, but should be relied on constantly without discussing with your doctor o Osmotics (Milk of Magnesia, Fleets phosphosoda, Magnesium citrate, MiraLax, GoLytely) are safer than  o Stimulants (Senokot, Castor Oil, Dulcolax, Ex Lax)    o Avoid taking laxatives for more than 7 days in a row.   IF SEVERELY CONSTIPATED, try a Bowel Retraining Program: o Do not use laxatives.  o Eat a diet high in roughage, such as bran cereals and leafy vegetables.  o Drink six (6) ounces of prune or apricot juice each morning.  o Eat two (2) large servings of stewed fruit  each day.  o Take one (1) heaping tablespoon of a psyllium-based bulking agent twice a day. Use sugar-free sweetener when possible to avoid excessive calories.  o Eat a normal breakfast.  o Set aside 15 minutes after breakfast to sit on the toilet, but do not strain to have a bowel movement.  o If you do not have a bowel movement by the third day, use an enema and repeat the above steps.   Controlling diarrhea o Switch to liquids and simpler foods for a few days to avoid stressing your intestines further. o Avoid dairy products (especially milk & ice cream) for a short time.  The intestines often can  lose the ability to digest lactose when stressed. o Avoid foods that cause gassiness or bloating.  Typical foods include beans and other legumes, cabbage, broccoli, and dairy foods.  Every person has some sensitivity to other foods, so listen to our body and avoid those foods that trigger problems for you. o Adding fiber (Citrucel, Metamucil, psyllium, Miralax) gradually can help thicken stools by absorbing excess fluid and retrain the intestines to act more normally.  Slowly increase the dose over a few weeks.  Too much fiber too soon can backfire and cause cramping & bloating. o Probiotics (such as active yogurt, Align, etc) may help repopulate the intestines and colon with normal bacteria and calm down a sensitive digestive tract.  Most studies show it to be of mild help, though, and such products can be costly. o Medicines: - Bismuth subsalicylate (ex. Kayopectate, Pepto Bismol) every 30 minutes for up to 6 doses can help control diarrhea.  Avoid if pregnant. - Loperamide (Immodium) can slow down diarrhea.  Start with two tablets (4mg  total) first and then try one tablet every 6 hours.  Avoid if you are having fevers or severe pain.  If you are not better or start feeling worse, stop all medicines and call your doctor for advice o Call your doctor if you are getting worse or not better.  Sometimes  further testing (cultures, endoscopy, X-ray studies, bloodwork, etc) may be needed to help diagnose and treat the cause of the diarrhea.  TROUBLESHOOTING IRREGULAR BOWELS 1) Avoid extremes of bowel movements (no bad constipation/diarrhea) 2) Miralax 17gm mixed in 8oz. water or juice-daily. May use BID as needed.  3) Gas-x,Phazyme, etc. as needed for gas & bloating.  4) Soft,bland diet. No spicy,greasy,fried foods.  5) Prilosec over-the-counter as needed  6) May hold gluten/wheat products from diet to see if symptoms improve.  7)  May try probiotics (Align, Activa, etc) to help calm the bowels down 7) If symptoms become worse call back immediately.

## 2018-04-02 MED ORDER — ACETAMINOPHEN 500 MG PO TABS: 1000.0000 mg | ORAL_TABLET | Freq: Three times a day (TID) | ORAL | 0 refills | Status: AC

## 2018-04-02 MED ORDER — IBUPROFEN 600 MG PO TABS: 600.0000 mg | ORAL_TABLET | Freq: Four times a day (QID) | ORAL | 0 refills | Status: AC | PRN

## 2018-04-02 MED ORDER — OXYCODONE HCL 5 MG PO TABS: 5.0000 mg | ORAL_TABLET | Freq: Four times a day (QID) | ORAL | 0 refills | Status: AC | PRN

## 2018-04-02 NOTE — Discharge Summary (Signed)
[facility] Discharge Summary   Patient ID: MUREL WIGLE MRN: [id number] DOB/AGE: [birth year] 47 y.o.  Admit date: 03/28/2018 Discharge date: 04/02/2018  Discharge Diagnosis Patient Active Problem List   Diagnosis Date Noted  . Left retroperitoneal mass s/p resection 03/28/2018 03/31/2018  . Financial difficulties limiting insurance and access to medical care 03/31/2018  . Cecal volvulus s/p right colectomy 03/28/2018 03/28/2018   Consultants None   Procedures Dr. Erroll Luna, 03/28/18 - Partial colectomy with resection of left retroperitoneal mass cystic measuring 10 cm x 10 cm  HPI:  Pt is a 47 yo female with a hx of bells palsy, cervical cancer s/p LEEP >32yrs ago, who presented to the ED with complaints of abdominal pain. Pt states she has had intermittent lower abdominal pain for >27yrs. She was told it might be her gallbladder. She does not have a PCP. Last night she began having pain that progressively worsened to severe, sharp, constant, lower abdominal pain. Nothing made it better. Associated nausea and abdominal bloating. No other associated symptoms. Last BM was normal and yesterday morning. No BM or flatus since onset of pain. Pt states only mild pain at this time as she just received morphine. Pt denies previous abdominal surgery or anticoagulants. No PO intake today.   Hospital Course:  Workup significant for CT abd/pelv showing cecal volvulus and large left retroperitoneal cystic structure and the patient was taken to the OR for the above procedure by Dr. Brantley Stage. She tolerated the procedure well and was transferred to the floor. Diet was advanced as tolerated. She began to mobilize. Pain was controlled with oral medications. On 04/02/18 the patients vitals were stable, bowel function returned, and medically stable for discharge. She will require follow up as below and knows to call with questions/concerns. Surgical pathology is still pending and we will call the  patient with these results.   Physical Exam: General:  Alert, NAD, pleasant, comfortable Abd:  Soft, appropriately tender, midline staples clean and dry no erythema, +BS, previous drain site (removed yesterday) clean and dry.    Allergies as of 04/02/2018      Reactions   Penicillins Rash   Has patient had a PCN reaction causing immediate rash, facial/tongue/throat swelling, SOB or lightheadedness with hypotension: No Has patient had a PCN reaction causing severe rash involving mucus membranes or skin necrosis: No Has patient had a PCN reaction that required hospitalization: No Has patient had a PCN reaction occurring within the last 10 years: No If all of the above answers are "NO", then may proceed with Cephalosporin use.   Sulfa Antibiotics Nausea And Vomiting      Medication List    TAKE these medications   acetaminophen 500 MG tablet Commonly known as:  TYLENOL Take 2 tablets (1,000 mg total) by mouth 3 (three) times daily.   ibuprofen 600 MG tablet Commonly known as:  ADVIL,MOTRIN Take 1 tablet (600 mg total) by mouth every 6 (six) hours as needed for fever, headache, mild pain, moderate pain or cramping.   oxyCODONE 5 MG immediate release tablet Commonly known as:  Oxy IR/ROXICODONE Take 1 tablet (5 mg total) by mouth every 6 (six) hours as needed for moderate pain.        Follow-up Information    Cornett, Marcello Moores, MD. Schedule an appointment as soon as possible for a visit in 3 week(s).   Specialty:  General Surgery Why:  Follow-up after your emergency colon resection and removal of the retroperitoneal mass Contact information: [address] [place] [address] [phone]        [facility], [place]. Go on 04/10/2018.   Specialty:  General Surgery Why:  at 10:00 AM for staple removal by a nurse. please arrive 30 minutes early to fill out any necessary paperwork.  Contact information: [address] [place] [phone]          Signed: Obie Dredge, [facility] 04/02/2018, 11:20 AM Pager: [phone] Consults: [phone] Mon-Fri 7:00 am-4:30 pm Sat-Sun 7:00 am-11:30 am

## 2018-04-05 ENCOUNTER — Encounter: Payer: Self-pay | Admitting: General Surgery

## 2019-06-10 ENCOUNTER — Other Ambulatory Visit: Payer: Self-pay

## 2019-06-10 DIAGNOSIS — Z20822 Contact with and (suspected) exposure to covid-19: Secondary | ICD-10-CM

## 2019-06-11 LAB — NOVEL CORONAVIRUS, NAA: SARS-CoV-2, NAA: NOT DETECTED

## 2019-11-23 IMAGING — CT CT ABD-PELV W/ CM
2 of 5 series · 16 of 46 positions shown, 18 images · IV contrast (omnipaque)
Comparison: None.

CLINICAL DATA: Abdominal pain

EXAM:
CT ABDOMEN AND PELVIS WITH CONTRAST
TECHNIQUE: Multidetector CT imaging of the abdomen and pelvis was performed
using the standard protocol following bolus administration of
intravenous contrast.
CONTRAST:  100mL OMNIPAQUE IOHEXOL 300 MG/ML  SOLN

[Series 3: a/p w/ 5mm · axial · 0.73mm/px · z∈[+728,+1088]mm · 13 of 82 slices shown, 15 images]
[im 5/82  soft-tissue]
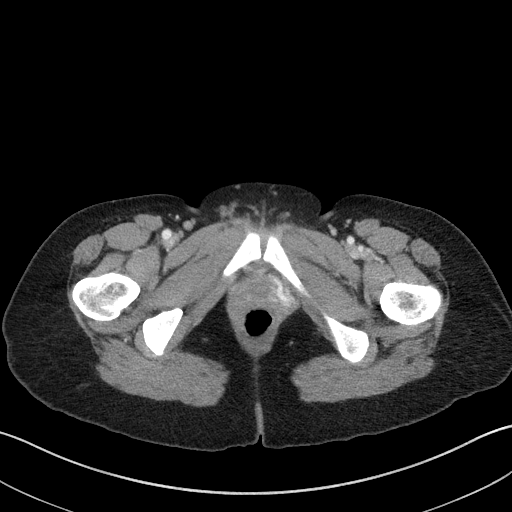
[im 5/82  bone]
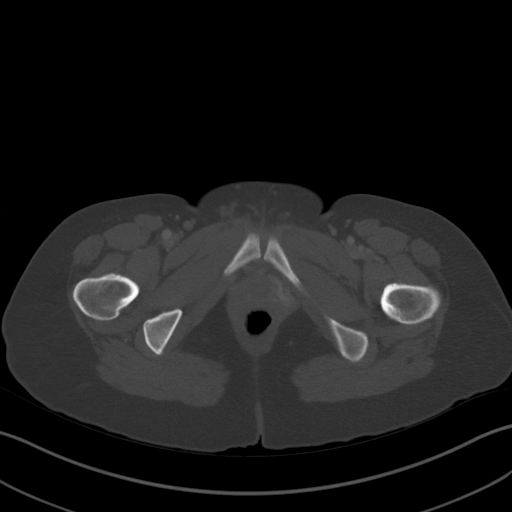
[im 10/82  soft-tissue]
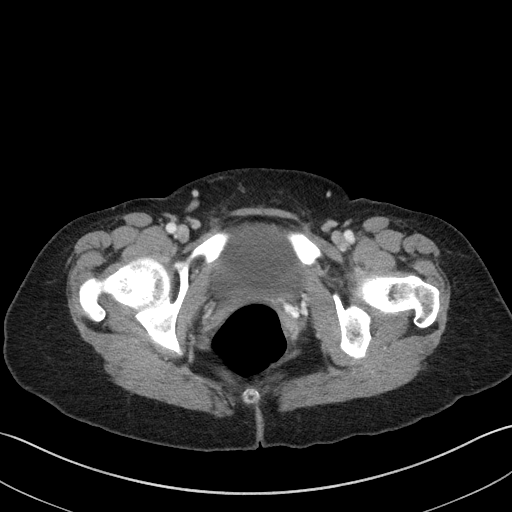
[im 20/82  soft-tissue]
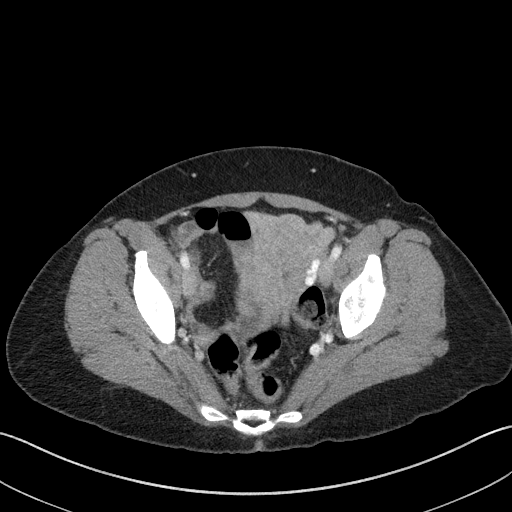
[im 24/82  soft-tissue]
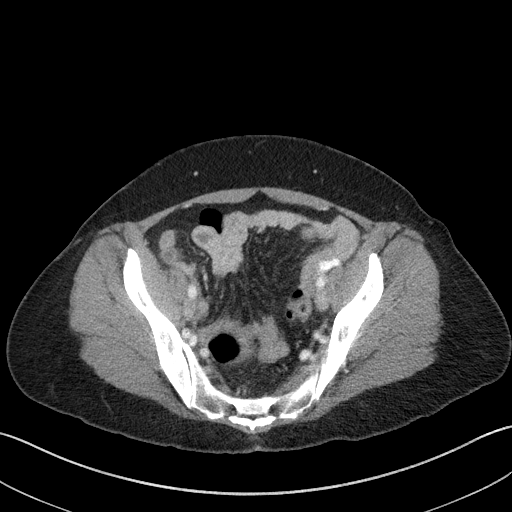
[im 29/82  soft-tissue]
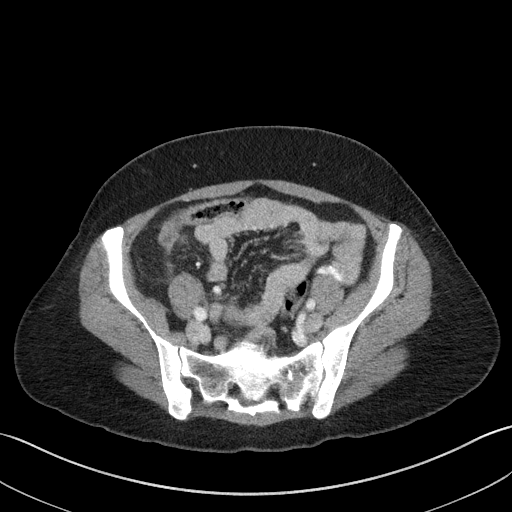
[im 34/82  soft-tissue]
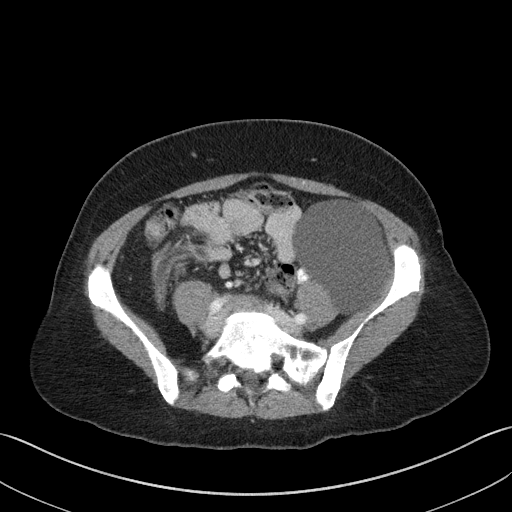
[im 43/82  soft-tissue]
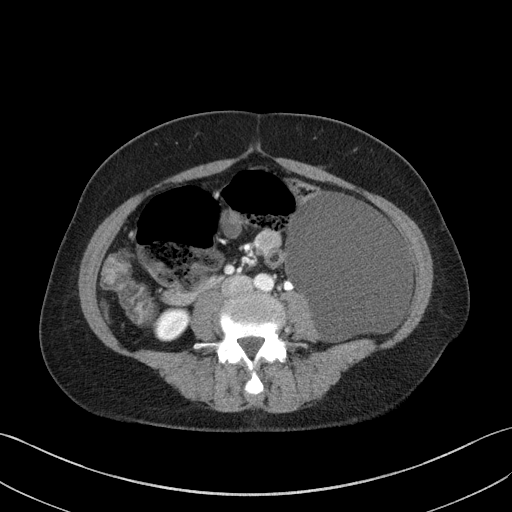
[im 48/82  soft-tissue]
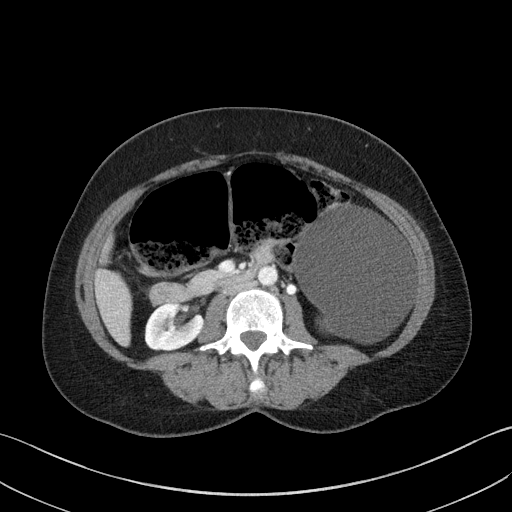
[im 53/82  soft-tissue]
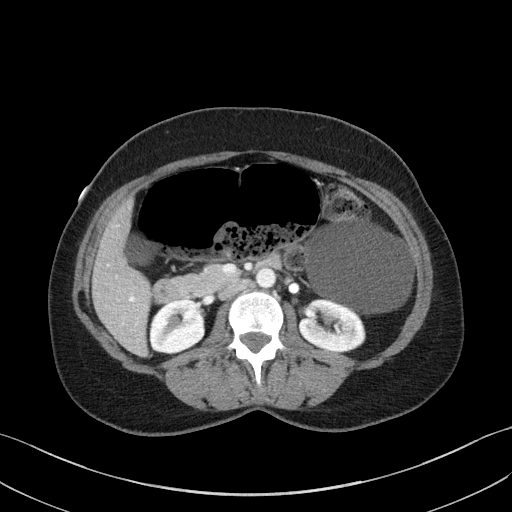
[im 53/82  bone]
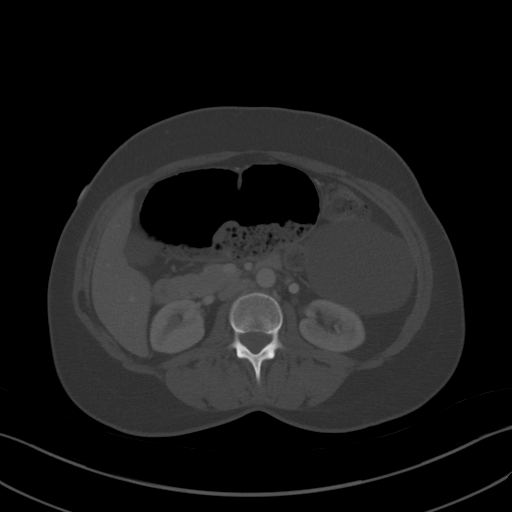
[im 58/82  soft-tissue]
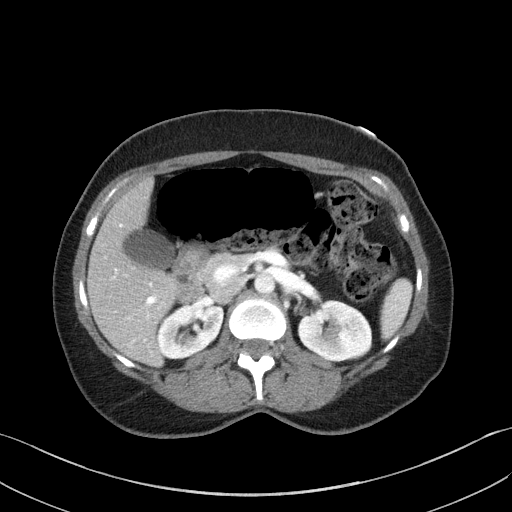
[im 62/82  soft-tissue]
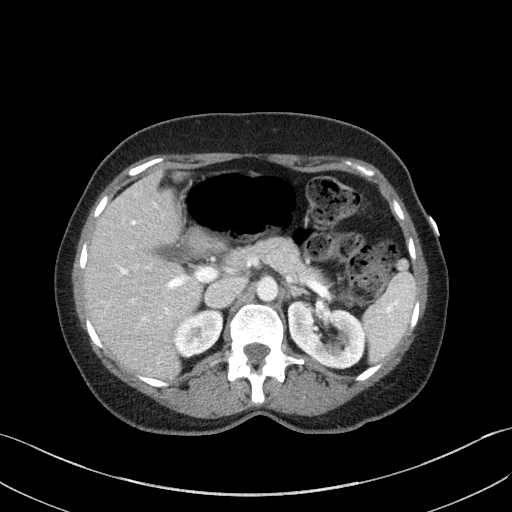
[im 72/82  soft-tissue]
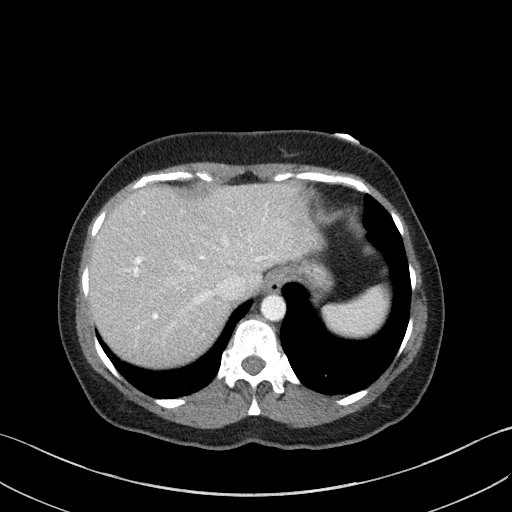
[im 77/82  soft-tissue]
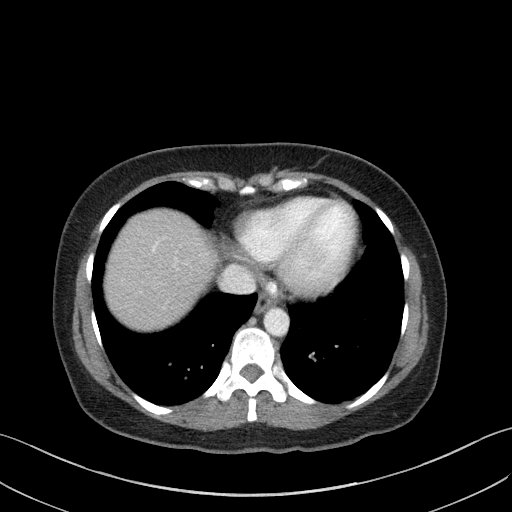

[Series 6: a/p w/ cor · coronal · 0.67mm/px · 3 of 143 slices shown]
[im 48/143  soft-tissue]
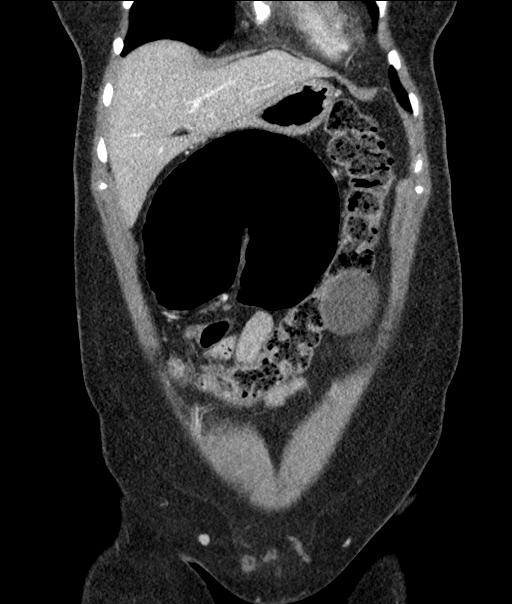
[im 64/143  soft-tissue]
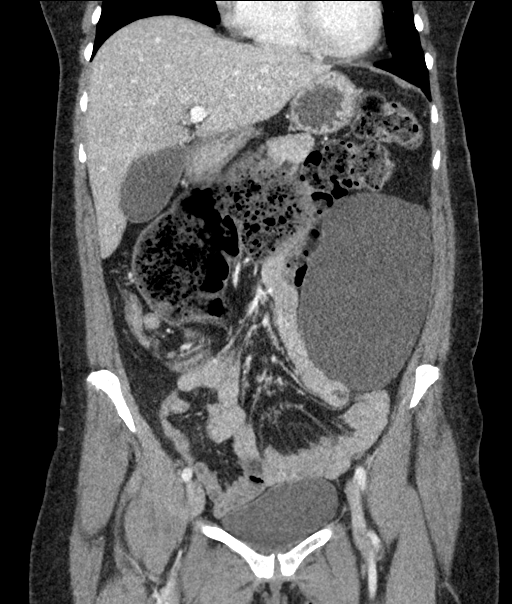
[im 79/143  soft-tissue]
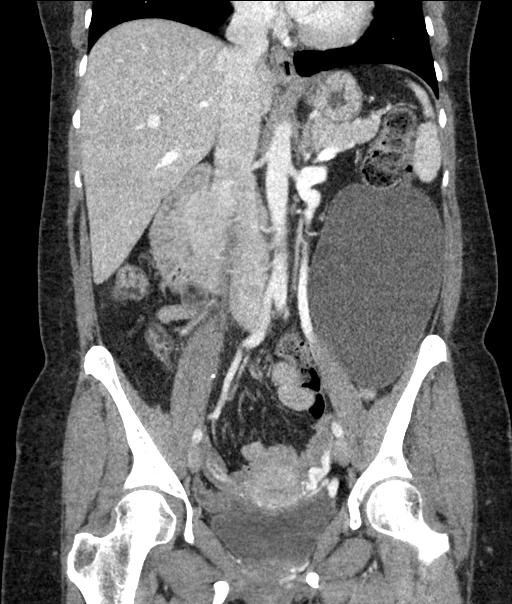

[16 of 46 positions shown; findings below may reference images not displayed]

FINDINGS: Lower chest: No acute abnormality.

Hepatobiliary: Diffuse hepatic steatosis. Gallbladder is
unremarkable.

Pancreas: Unremarkable

Spleen: Unremarkable

Adrenals/Urinary Tract: Kidneys and adrenal glands are within normal
limits. Bladder is within normal limits.

Stomach/Bowel: The cecum is dilated and in the upper abdomen with Ronlor
Arissa Billiot configuration. There is twisting of the cecal mesentery
in the right lower quadrant. There are no disproportionally dilated
loops of small bowel. These findings are most consistent with cecal
volvulus without obstruction. Stomach is unremarkable. Duodenum is
within normal limits.

Vascular/Lymphatic: There is severe narrowing of the left renal
vein. There is collateral outflow through the left ovarian vein
leading to pelvic varices. No evidence of aortic aneurysm.

Reproductive: Uterus is within normal limits. Other than varices,
adnexa are unremarkable.

Other: There is a large simple cyst within the left side of the
abdomen measuring 10.8 x 9.4 x 13.7 cm. It appears to emanate from
the retroperitoneum and has a benign appearance.

Musculoskeletal: No vertebral compression deformity.
IMPRESSION: Findings are most consistent with cecal volvulus without definitive
bowel obstruction. Correlate clinically as for the need for
reduction.

Large simple cyst in the left retroperitoneum likely a GI
duplication cyst.

Narrowing of the left renal vein with collateral flow through the
pelvic vasculature.

## 2019-12-22 ENCOUNTER — Other Ambulatory Visit: Payer: Self-pay

## 2019-12-22 ENCOUNTER — Emergency Department
Admission: EM | Admit: 2019-12-22 | Discharge: 2019-12-22 | Disposition: A | Discharge status: Home / Self Care | Payer: Self-pay | Attending: Emergency Medicine | Admitting: Emergency Medicine

## 2019-12-22 ENCOUNTER — Emergency Department: Payer: Self-pay

## 2019-12-22 ENCOUNTER — Encounter: Payer: Self-pay | Admitting: Emergency Medicine

## 2019-12-22 DIAGNOSIS — R0602 Shortness of breath: Secondary | ICD-10-CM | POA: Insufficient documentation

## 2019-12-22 DIAGNOSIS — Z8541 Personal history of malignant neoplasm of cervix uteri: Secondary | ICD-10-CM | POA: Insufficient documentation

## 2019-12-22 DIAGNOSIS — R0789 Other chest pain: Secondary | ICD-10-CM | POA: Insufficient documentation

## 2019-12-22 LAB — BASIC METABOLIC PANEL
Anion gap: 10 (ref 5–15)
BUN: 17 mg/dL (ref 6–20)
CO2: 22 mmol/L (ref 22–32)
Calcium: 9.2 mg/dL (ref 8.9–10.3)
Chloride: 107 mmol/L (ref 98–111)
Creatinine, Ser: 0.67 mg/dL (ref 0.44–1.00)
GFR calc Af Amer: >60 mL/min (ref >60)
GFR calc non Af Amer: >60 mL/min (ref >60)
Glucose, Bld: 98 mg/dL (ref 70–99)
Potassium: 4 mmol/L (ref 3.5–5.1)
Sodium: 139 mmol/L (ref 135–145)

## 2019-12-22 LAB — CBC
HCT: 37.8 % (ref 36.0–46.0)
Hemoglobin: 12.4 g/dL (ref 12.0–15.0)
MCH: 28.8 pg (ref 26.0–34.0)
MCHC: 32.8 g/dL (ref 30.0–36.0)
MCV: 87.9 fL (ref 80.0–100.0)
Platelets: 218 10*3/uL (ref 150–400)
RBC: 4.3 MIL/uL (ref 3.87–5.11)
RDW: 13.3 % (ref 11.5–15.5)
WBC: 6.1 10*3/uL (ref 4.0–10.5)
nRBC: 0 % (ref 0.0–0.2)

## 2019-12-22 LAB — TROPONIN I (HIGH SENSITIVITY): Troponin I (High Sensitivity): <2 ng/L (ref <18)

## 2019-12-22 NOTE — ED Provider Notes (Signed)
[facility] Emergency Department Provider Note ____________________________________________   First MD Initiated Contact with Patient 12/22/19 1219     (approximate)  I have reviewed the triage vital signs and the nursing notes.   HISTORY  Chief Complaint Chest Pain    HPI Tracey Stone is a 49 y.o. female with PMH as noted below who presents with chest pain, acute onset at around 11 AM, lasting about 20 to 30 minutes, now subsided.  It was sharp and mainly in the right side of her chest.  It was nonradiating.  She reports mild associated shortness of breath but no nausea or lightheadedness.  She denies any prior history of similar pain.  It started while she was driving.  Past Medical History:  Diagnosis Date  . Bell's palsy   . Cervical cancer ([place]) 1990s   LEEP for margins     Patient Active Problem List   Diagnosis Date Noted  . Left retroperitoneal mass s/p resection 03/28/2018 03/31/2018  . Financial difficulties limiting insurance and access to medical care 03/31/2018  . Cecal volvulus s/p right colectomy 03/28/2018 03/28/2018    Past Surgical History:  Procedure Laterality Date  . COLON RESECTION Right 03/28/2018   Procedure: RIGHT PARTIAL COLECTOMY, LEFT RETROPERITONEAL CYSTIC MASS;  Surgeon: Erroll Luna, MD;  Location: [facility];  Service: General;  Laterality: Right;    Prior to Admission medications   Medication Sig Start Date End Date Taking? Authorizing Provider  acetaminophen (TYLENOL) 500 MG tablet Take 2 tablets (1,000 mg total) by mouth 3 (three) times daily. 04/02/18   Jill Alexanders, PA-C  ibuprofen (ADVIL,MOTRIN) 600 MG tablet Take 1 tablet (600 mg total) by mouth every 6 (six) hours as needed for fever, headache, mild pain, moderate pain or cramping. 04/02/18   Simaan, Darci Current, PA-C  oxyCODONE (OXY IR/ROXICODONE) 5 MG immediate release tablet Take 1 tablet (5 mg total) by mouth every 6 (six) hours as needed for moderate  pain. 04/02/18   Jill Alexanders, PA-C    Allergies Penicillins and Sulfa antibiotics  Family History  Problem Relation Age of Onset  . Hypertension Mother   . Cancer Maternal Aunt     Social History Social History   Tobacco Use  . Smoking status: Never Smoker  . Smokeless tobacco: Never Used  Substance Use Topics  . Alcohol use: No  . Drug use: No    Review of Systems  Constitutional: No fever/chills. Eyes: No visual changes. ENT: No sore throat. Cardiovascular: Positive for resolved chest pain. Respiratory: Positive for resolved shortness of breath. Gastrointestinal: No vomiting or diarrhea.  Genitourinary: Negative for dysuria.  Musculoskeletal: Negative for back pain. Skin: Negative for rash. Neurological: Negative for headache.   ____________________________________________   PHYSICAL EXAM:  VITAL SIGNS: ED Triage Vitals  Enc Vitals Group     BP 12/22/19 1112 (!) 134/92     Pulse Rate 12/22/19 1112 68     Resp 12/22/19 1112 20     Temp 12/22/19 1112 97.8 F (36.6 C)     Temp Source 12/22/19 1112 Oral     SpO2 12/22/19 1112 100 %     Weight 12/22/19 1057 135 lb (61.2 kg)     Height 12/22/19 1057 5\' 3"  (1.6 m)     Head Circumference --      Peak Flow --      Pain Score 12/22/19 1101 3     Pain Loc --      Pain Edu? --  Excl. in [place]? --     Constitutional: Alert and oriented. Well appearing and in no acute distress. Eyes: Conjunctivae are normal.  Head: Atraumatic. Nose: No congestion/rhinnorhea. Mouth/Throat: Mucous membranes are moist.   Neck: Normal range of motion.  Cardiovascular: Normal rate, regular rhythm. Grossly normal heart sounds.  Good peripheral circulation. Respiratory: Normal respiratory effort.  No retractions. Lungs CTAB. Gastrointestinal: No distention.  Musculoskeletal: Extremities warm and well perfused.  Neurologic:  Normal speech and language. No gross focal neurologic deficits are appreciated.  Skin:  Skin is warm  and dry. No rash noted. Psychiatric: Mood and affect are normal. Speech and behavior are normal.  ____________________________________________   LABS (all labs ordered are listed, but only abnormal results are displayed)  Labs Reviewed  BASIC METABOLIC PANEL  CBC  TROPONIN I (HIGH SENSITIVITY)  TROPONIN I (HIGH SENSITIVITY)   ____________________________________________  EKG  ED ECG REPORT I, Arta Silence, the attending physician, personally viewed and interpreted this ECG.  Date: 12/22/2019 EKG Time: 1035 Rate: 80 Rhythm: normal sinus rhythm QRS Axis: normal Intervals: normal ST/T Wave abnormalities: normal Narrative Interpretation: no evidence of acute ischemia  ____________________________________________  RADIOLOGY  CXR: No focal infiltrate or other acute abnormality  ____________________________________________   PROCEDURES  Procedure(s) performed: No  Procedures  Critical Care performed: No ____________________________________________   INITIAL IMPRESSION / ASSESSMENT AND PLAN / ED COURSE  Pertinent labs & imaging results that were available during my care of the patient were reviewed by me and considered in my medical decision making (see chart for details).  49 year old female with PMH as noted above and no prior cardiac history presents with atypical chest pain that lasted about 20 to 30 minutes and has now resolved.  On exam, she is overall well-appearing.  Her vital signs are normal.  The physical exam is unremarkable.  EKG shows no acute ischemic findings.   Overall presentation is consistent with benign etiology such as musculoskeletal pain or GERD.  There is no evidence of cardiac cause, and the patient has no ACS risk factors.  I also do not suspect PE given that the patient spontaneously resolved.  The patient has no PE risk factors and is PERC negative.  I recommended cardiac enzymes x2.  The initial basic labs including the first  troponin are negative.  However, the patient declines further work-up.  She understands that since it has only been a few hours since the pain, that we cannot completely rule out ACS which could be life-threatening.  However, she demonstrates appropriate understanding of this risk, and appropriate decision-making capacity.  She understands she may return at any time if she changes her mind or has recurrent or worsening symptoms.  Return precautions given, and she expresses understanding.  ____________________________________________   FINAL CLINICAL IMPRESSION(S) / ED DIAGNOSES  Final diagnoses:  Atypical chest pain      NEW MEDICATIONS STARTED DURING THIS VISIT:  Discharge Medication List as of 12/22/2019 12:49 PM       Note:  This document was prepared using Dragon voice recognition software and may include unintentional dictation errors.   Arta Silence, MD 12/22/19 1316

## 2019-12-22 NOTE — Discharge Instructions (Addendum)
Return to the ER for new, worsening, or persistent severe chest pain, difficulty breathing, weakness or lightheadedness, or any other new or worsening symptoms that concern you.  Follow-up with your regular doctor.

## 2019-12-22 NOTE — ED Triage Notes (Signed)
Pt arrived via POV states as she was driving today started having chest pain which caused her to have shortness of breath.  Pt states the CP is sharp and tight in nature in the center of chest that radiates under right breast.

## 2019-12-22 NOTE — ED Notes (Signed)
Pt signed paper copy for the d/c signature.

## 2020-08-25 ENCOUNTER — Encounter (HOSPITAL_COMMUNITY): Payer: Self-pay | Admitting: Emergency Medicine

## 2020-08-25 ENCOUNTER — Emergency Department (HOSPITAL_COMMUNITY)
Admission: EM | Admit: 2020-08-25 | Discharge: 2020-08-25 | Disposition: A | Discharge status: Home / Self Care | Payer: Self-pay | Attending: Emergency Medicine | Admitting: Emergency Medicine

## 2020-08-25 DIAGNOSIS — Y9271 Barn as the place of occurrence of the external cause: Secondary | ICD-10-CM | POA: Insufficient documentation

## 2020-08-25 DIAGNOSIS — W268XXA Contact with other sharp object(s), not elsewhere classified, initial encounter: Secondary | ICD-10-CM | POA: Insufficient documentation

## 2020-08-25 DIAGNOSIS — Z8541 Personal history of malignant neoplasm of cervix uteri: Secondary | ICD-10-CM | POA: Insufficient documentation

## 2020-08-25 DIAGNOSIS — Y93K9 Activity, other involving animal care: Secondary | ICD-10-CM | POA: Insufficient documentation

## 2020-08-25 DIAGNOSIS — S61212A Laceration without foreign body of right middle finger without damage to nail, initial encounter: Secondary | ICD-10-CM | POA: Insufficient documentation

## 2020-08-25 MED ORDER — LIDOCAINE HCL (PF) 1 % IJ SOLN
5.0000 mL | Freq: Once | INTRAMUSCULAR | Status: AC
  Administered 2020-08-25: 5 mL
  Filled 2020-08-25: qty 5

## 2020-08-25 NOTE — Discharge Instructions (Signed)
Please read and follow all provided instructions.  Your diagnoses today include:  1. Laceration of right middle finger without foreign body without damage to nail, initial encounter     Tests performed today include:  Vital signs. See below for your results today.   Medications prescribed:   None  Take any prescribed medications only as directed.   Home care instructions:  Follow any educational materials and wound care instructions contained in this packet.   Keep affected area above the level of your heart when possible to minimize swelling. Wash area gently twice a day with warm soapy water. Do not apply alcohol or hydrogen peroxide. Cover the area if it draining or weeping.   Return instructions:  Return to the Emergency Department if you have:  Fever  Worsening pain  Worsening swelling of the wound  Pus draining from the wound  Redness of the skin that moves away from the wound, especially if it streaks away from the affected area   Any other emergent concerns  Your vital signs today were: BP (!) 131/92    Pulse 77    Temp 98.1 F (36.7 C)    Resp 15    LMP 06/16/2013    SpO2 99%  If your blood pressure (BP) was elevated above 135/85 this visit, please have this repeated by your doctor within one month. --------------

## 2020-08-25 NOTE — ED Triage Notes (Signed)
Pt reports she cut her R middle finger while cleaning out her horse stall today. Tetanus shot unknown.

## 2020-08-25 NOTE — Progress Notes (Signed)
Orthopedic Tech Progress Note Patient Details:  Tracey Stone [birth year] [id number]  Ortho Devices Type of Ortho Device: Finger splint Ortho Device/Splint Location: RUE Ortho Device/Splint Interventions: Application   Post Interventions Patient Tolerated: Well Instructions Provided: Care of device   Donald Pore 08/25/2020, 3:42 PM

## 2020-08-25 NOTE — ED Provider Notes (Signed)
[facility] EMERGENCY DEPARTMENT Provider Note   CSN: [id number] Arrival date & time: 08/25/20  1149     History Chief Complaint  Patient presents with  . Finger Injury    Tracey Stone is a 49 y.o. female.  Patient presents the emergency department today for evaluation of her right middle finger laceration.  She was cleaning out horse stalls and cut it on a piece of metal.  She states that when she bent the finger, the bleeding was worse, prompting emergency department evaluation.  Wound was cleaned with water prior to arrival.  Unknown last tetanus shot.  Patient refuses tetanus update (states she steps on things all the time and she doesn't do well with shots). No other complaints. Denies numbness, tingling, weakness.         Past Medical History:  Diagnosis Date  . Bell's palsy   . Cervical cancer ([facility]) 1990s   LEEP for margins     Patient Active Problem List   Diagnosis Date Noted  . Left retroperitoneal mass s/p resection 03/28/2018 03/31/2018  . Financial difficulties limiting insurance and access to medical care 03/31/2018  . Cecal volvulus s/p right colectomy 03/28/2018 03/28/2018    Past Surgical History:  Procedure Laterality Date  . COLON RESECTION Right 03/28/2018   Procedure: RIGHT PARTIAL COLECTOMY, LEFT RETROPERITONEAL CYSTIC MASS;  Surgeon: Harriette Bouillon, MD;  Location: [facility] OR;  Service: General;  Laterality: Right;     OB History   No obstetric history on file.     Family History  Problem Relation Age of Onset  . Hypertension Mother   . Cancer Maternal Aunt     Social History   Tobacco Use  . Smoking status: Never Smoker  . Smokeless tobacco: Never Used  Vaping Use  . Vaping Use: Never used  Substance Use Topics  . Alcohol use: No  . Drug use: No    Home Medications Prior to Admission medications   Medication Sig Start Date End Date Taking? Authorizing Provider  acetaminophen (TYLENOL) 500 MG tablet Take 2  tablets (1,000 mg total) by mouth 3 (three) times daily. 04/02/18   Adam Phenix, PA-C  ibuprofen (ADVIL,MOTRIN) 600 MG tablet Take 1 tablet (600 mg total) by mouth every 6 (six) hours as needed for fever, headache, mild pain, moderate pain or cramping. 04/02/18   Simaan, Francine Graven, PA-C  oxyCODONE (OXY IR/ROXICODONE) 5 MG immediate release tablet Take 1 tablet (5 mg total) by mouth every 6 (six) hours as needed for moderate pain. 04/02/18   Adam Phenix, PA-C    Allergies    Penicillins and Sulfa antibiotics  Review of Systems   Review of Systems  Constitutional: Negative for activity change.  Musculoskeletal: Positive for arthralgias. Negative for back pain, joint swelling and neck pain.  Skin: Positive for wound.  Neurological: Negative for weakness and numbness.    Physical Exam Updated Vital Signs BP (!) 131/92   Pulse 77   Temp 98.1 F (36.7 C)   Resp 15   LMP 06/16/2013   SpO2 99%   Physical Exam Vitals and nursing note reviewed.  Constitutional:      Appearance: She is well-developed.  HENT:     Head: Normocephalic and atraumatic.  Eyes:     Conjunctiva/sclera: Conjunctivae normal.  Pulmonary:     Effort: No respiratory distress.  Musculoskeletal:     Cervical back: Normal range of motion and neck supple.  Skin:    General: Skin is  warm and dry.     Comments: R long finger: 1cm, hemostatic, clean, nongaping laceration to the dorsal aspect of the right long finger partially overlying the DIP joint. Patient able to flex and extend at DIP/PIP/MCP without difficulty. Distal sensation intact.   Neurological:     Mental Status: She is alert.     ED Results / Procedures / Treatments   Labs (all labs ordered are listed, but only abnormal results are displayed) Labs Reviewed - No data to display  EKG None  Radiology No results found.  Procedures .Marland KitchenLaceration Repair  Date/Time: 08/25/2020 3:17 PM Performed by: Renne Crigler, PA-C Authorized by:  Renne Crigler, PA-C   Consent:    Consent obtained:  Verbal   Consent given by:  Patient   Risks discussed:  Pain, infection and poor cosmetic result   Alternatives discussed: suture. Anesthesia:    Anesthesia method:  None Skin repair:    Repair method:  Tissue adhesive Approximation:    Approximation:  Close Repair type:    Repair type:  Simple Post-procedure details:    Dressing:  Open (no dressing)   Procedure completion:  Tolerated well, no immediate complications   (including critical care time)  Medications Ordered in ED Medications  lidocaine (PF) (XYLOCAINE) 1 % injection 5 mL (5 mLs Infiltration Given 08/25/20 1429)    ED Course  I have reviewed the triage vital signs and the nursing notes.  Pertinent labs & imaging results that were available during my care of the patient were reviewed by me and considered in my medical decision making (see chart for details).  Patient seen and examined.  Patient with superficial laceration to the finger.  Patient is adamant about not receiving sutures.  Even with flexion of the finger, it is nongaping.  Discussed use of Dermabond with patient.  Plan: dermabond, finger splint.   Vital signs reviewed and are as follows: BP (!) 131/92   Pulse 77   Temp 98.1 F (36.7 C)   Resp 15   LMP 06/16/2013   SpO2 99%   Patient counseled on wound care.  Patient was urged to return to the Emergency Department urgently with worsening pain, swelling, expanding erythema especially if it streaks away from the affected area, fever, or if they have any other concerns. Patient verbalized understanding.      MDM Rules/Calculators/A&P                          Minor finger laceration. Repaired as above.     Final Clinical Impression(s) / ED Diagnoses Final diagnoses:  Laceration of right middle finger without foreign body without damage to nail, initial encounter    Rx / DC Orders ED Discharge Orders    None       Renne Crigler,  PA-C 08/25/20 1518    Wilkie Aye Mayer Masker, MD 08/25/20 1539

## 2021-08-18 IMAGING — CR DG CHEST 2V
1 series · 2 of 2 positions shown · non-contrast
Comparison: March 28, 2018

CLINICAL DATA: Chest pain.

EXAM:
CHEST - 2 VIEW

[Series 1: dg chest 2 view · 0.14mm/px · 2 of 2 slices shown]
[im 1/2]
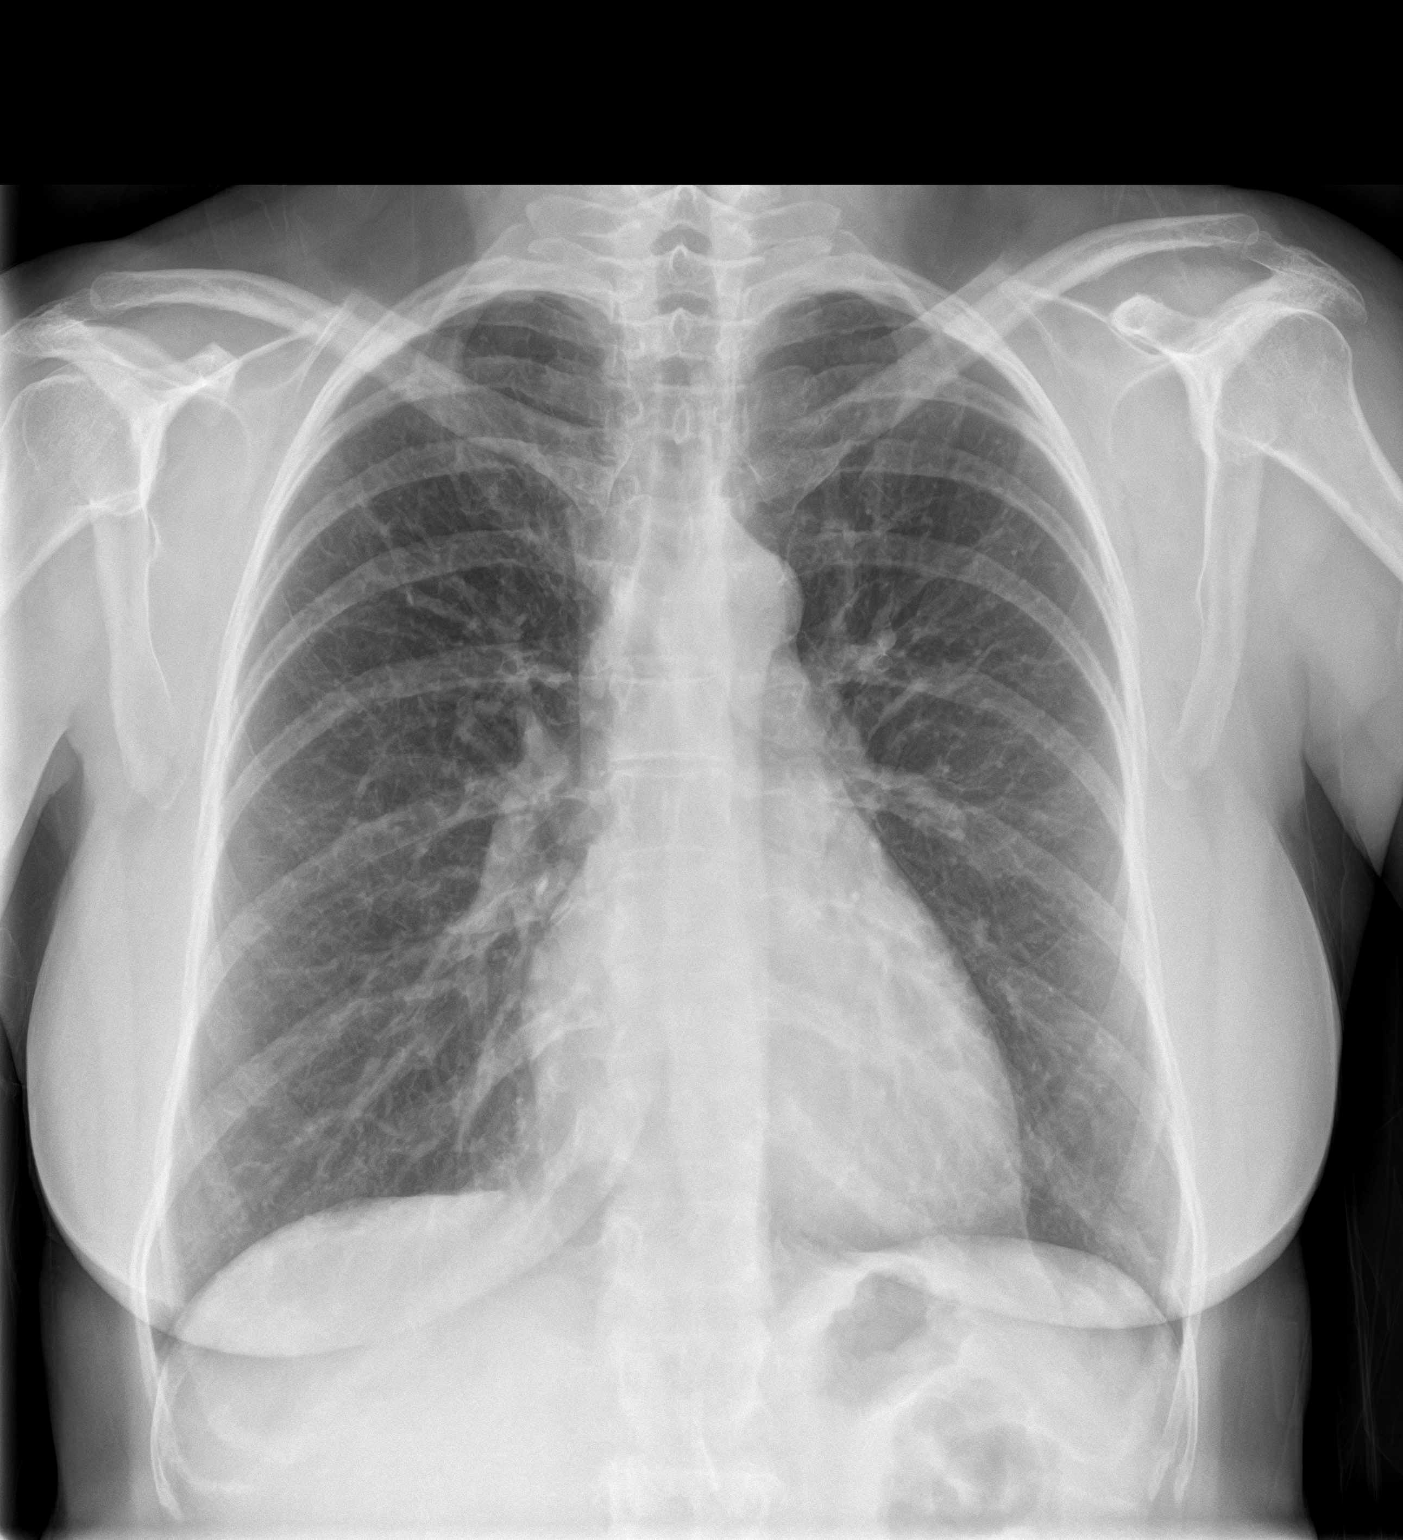
[im 2/2]
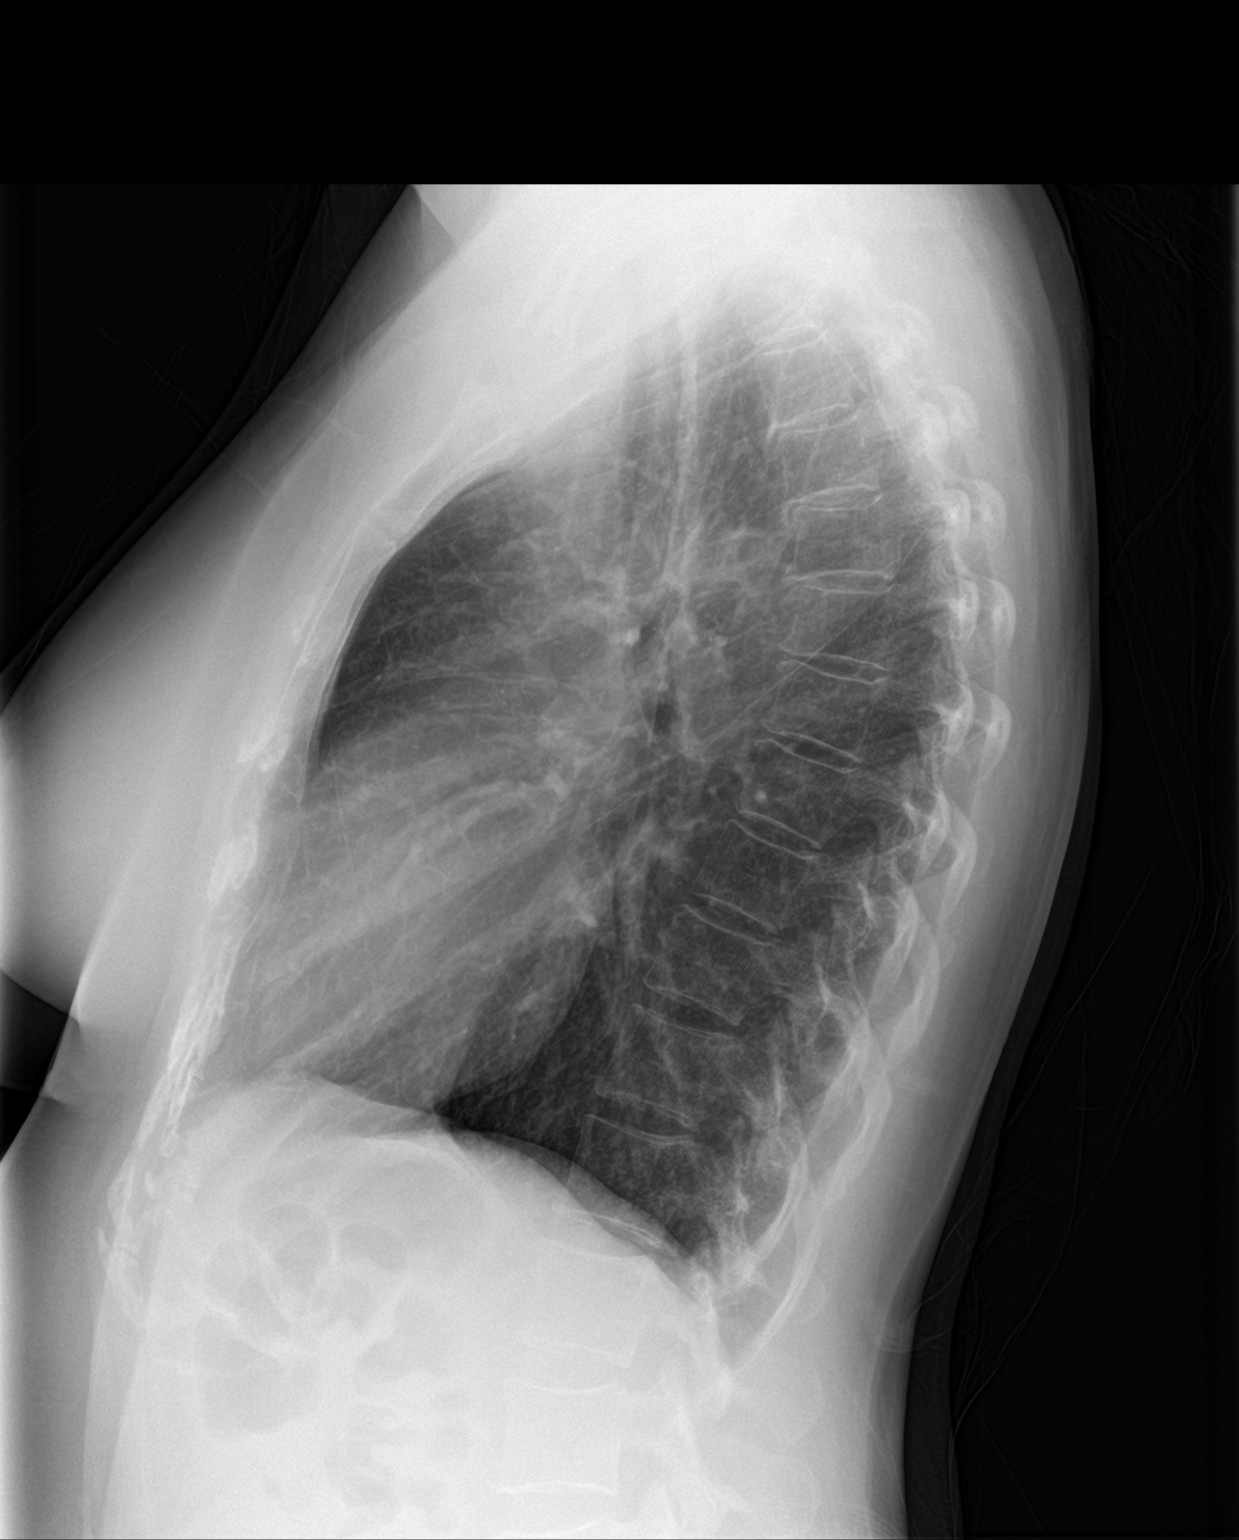

[2 of 2 positions shown; findings below may reference images not displayed]

FINDINGS: The heart size and mediastinal contours are within normal limits.
Both lungs are clear. The visualized skeletal structures are
unremarkable.
IMPRESSION: No active cardiopulmonary disease.
